# Patient Record
Sex: Female | Born: 1965 | Race: Black or African American | Hispanic: No | Marital: Married | Smoking: Never smoker
Health system: Southern US, Community
[De-identification: ages and names within clinical notes are randomized; demographics above are authoritative.]

## PROBLEM LIST (undated history)

## (undated) DIAGNOSIS — D649 Anemia, unspecified: Secondary | ICD-10-CM

## (undated) DIAGNOSIS — M797 Fibromyalgia: Secondary | ICD-10-CM

## (undated) DIAGNOSIS — E559 Vitamin D deficiency, unspecified: Secondary | ICD-10-CM

## (undated) DIAGNOSIS — Z9889 Other specified postprocedural states: Secondary | ICD-10-CM

## (undated) HISTORY — PX: THIGH LIFT: SHX2496

## (undated) HISTORY — DX: Anemia, unspecified: D64.9

## (undated) HISTORY — DX: Vitamin D deficiency, unspecified: E55.9

## (undated) HISTORY — PX: OTHER SURGICAL HISTORY: SHX169

## (undated) HISTORY — DX: Other specified postprocedural states: Z98.890

## (undated) HISTORY — DX: Fibromyalgia: M79.7

---
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
## 1999-09-23 ENCOUNTER — Other Ambulatory Visit: Admission: RE | Admit: 1999-09-23 | Discharge: 1999-09-23 | Payer: Self-pay | Admitting: Family Medicine

## 1999-10-03 ENCOUNTER — Encounter: Admission: RE | Admit: 1999-10-03 | Discharge: 2000-01-01 | Payer: Self-pay | Admitting: Family Medicine

## 1999-10-31 ENCOUNTER — Encounter: Admission: RE | Admit: 1999-10-31 | Discharge: 1999-10-31 | Payer: Self-pay | Admitting: Family Medicine

## 1999-10-31 ENCOUNTER — Encounter: Payer: Self-pay | Admitting: Family Medicine

## 2000-11-16 ENCOUNTER — Encounter: Admission: RE | Admit: 2000-11-16 | Discharge: 2001-02-14 | Payer: Self-pay | Admitting: Family Medicine

## 2000-11-16 ENCOUNTER — Other Ambulatory Visit: Admission: RE | Admit: 2000-11-16 | Discharge: 2000-11-16 | Payer: Self-pay | Admitting: Family Medicine

## 2000-11-30 HISTORY — PX: OTHER SURGICAL HISTORY: SHX169

## 2001-03-11 ENCOUNTER — Encounter: Admission: RE | Admit: 2001-03-11 | Discharge: 2001-06-09 | Payer: Self-pay | Admitting: Family Medicine

## 2003-12-01 HISTORY — PX: ABDOMINOPLASTY: SUR9

## 2004-11-10 ENCOUNTER — Encounter (HOSPITAL_COMMUNITY): Admission: RE | Admit: 2004-11-10 | Discharge: 2004-11-29 | Payer: Self-pay | Admitting: Family Medicine

## 2004-11-27 ENCOUNTER — Ambulatory Visit: Payer: Self-pay | Admitting: Oncology

## 2004-11-28 ENCOUNTER — Ambulatory Visit (HOSPITAL_COMMUNITY): Admission: AD | Admit: 2004-11-28 | Discharge: 2004-11-28 | Payer: Self-pay | Admitting: Oncology

## 2005-02-25 ENCOUNTER — Ambulatory Visit: Payer: Self-pay | Admitting: Oncology

## 2005-03-02 ENCOUNTER — Ambulatory Visit (HOSPITAL_COMMUNITY): Admission: RE | Admit: 2005-03-02 | Discharge: 2005-03-02 | Payer: Self-pay | Admitting: Unknown Physician Specialty

## 2005-05-01 ENCOUNTER — Other Ambulatory Visit: Admission: RE | Admit: 2005-05-01 | Discharge: 2005-05-01 | Payer: Self-pay | Admitting: Obstetrics and Gynecology

## 2005-05-27 ENCOUNTER — Ambulatory Visit: Payer: Self-pay | Admitting: Oncology

## 2005-07-02 ENCOUNTER — Encounter: Admission: RE | Admit: 2005-07-02 | Discharge: 2005-07-02 | Payer: Self-pay | Admitting: Obstetrics and Gynecology

## 2005-09-02 ENCOUNTER — Ambulatory Visit: Payer: Self-pay | Admitting: Oncology

## 2005-12-09 ENCOUNTER — Ambulatory Visit: Payer: Self-pay | Admitting: Oncology

## 2006-03-10 ENCOUNTER — Ambulatory Visit: Payer: Self-pay | Admitting: Oncology

## 2006-03-11 LAB — CBC WITH DIFFERENTIAL/PLATELET
BASO%: 0.5 % (ref 0.0–2.0)
Basophils Absolute: 0 10*3/uL (ref 0.0–0.1)
Eosinophils Absolute: 0.1 10*3/uL (ref 0.0–0.5)
HCT: 37.6 % (ref 34.8–46.6)
MCH: 31 pg (ref 26.0–34.0)
NEUT#: 3.3 10*3/uL (ref 1.5–6.5)
NEUT%: 64.5 % (ref 39.6–76.8)
Platelets: 173 10*3/uL (ref 145–400)
RDW: 13.7 % (ref 11.3–14.5)
WBC: 5.2 10*3/uL (ref 3.9–10.0)
lymph#: 1.1 10*3/uL (ref 0.9–3.3)

## 2006-03-11 LAB — IRON AND TIBC
Iron: 49 ug/dL (ref 42–145)
TIBC: 326 ug/dL (ref 250–470)
UIBC: 277 ug/dL

## 2006-06-15 ENCOUNTER — Ambulatory Visit: Payer: Self-pay | Admitting: Oncology

## 2006-06-18 LAB — CBC WITH DIFFERENTIAL/PLATELET
Basophils Absolute: 0 10*3/uL (ref 0.0–0.1)
MCH: 31.2 pg (ref 26.0–34.0)
RBC: 4.05 10*6/uL (ref 3.70–5.32)
RDW: 14 % (ref 11.3–14.5)
WBC: 4.1 10*3/uL (ref 3.9–10.0)
lymph#: 1 10*3/uL (ref 0.9–3.3)

## 2006-06-18 LAB — IRON AND TIBC
TIBC: 253 ug/dL (ref 250–470)
UIBC: 168 ug/dL

## 2006-06-30 ENCOUNTER — Encounter: Payer: Self-pay | Admitting: Vascular Surgery

## 2006-06-30 ENCOUNTER — Ambulatory Visit (HOSPITAL_COMMUNITY): Admission: RE | Admit: 2006-06-30 | Discharge: 2006-06-30 | Payer: Self-pay | Admitting: Family Medicine

## 2006-07-12 ENCOUNTER — Other Ambulatory Visit: Admission: RE | Admit: 2006-07-12 | Discharge: 2006-07-12 | Payer: Self-pay | Admitting: Obstetrics and Gynecology

## 2006-09-07 ENCOUNTER — Encounter: Admission: RE | Admit: 2006-09-07 | Discharge: 2006-09-07 | Payer: Self-pay | Admitting: Obstetrics and Gynecology

## 2006-12-14 ENCOUNTER — Ambulatory Visit: Payer: Self-pay | Admitting: Oncology

## 2006-12-17 LAB — IRON AND TIBC
%SAT: 43 % (ref 20–55)
Iron: 109 ug/dL (ref 42–145)
TIBC: 255 ug/dL (ref 250–470)

## 2006-12-17 LAB — COMPREHENSIVE METABOLIC PANEL
Glucose, Bld: 57 mg/dL — ABNORMAL LOW (ref 70–99)
Total Bilirubin: 1.5 mg/dL — ABNORMAL HIGH (ref 0.3–1.2)

## 2006-12-17 LAB — CBC WITH DIFFERENTIAL/PLATELET
BASO%: 1 % (ref 0.0–2.0)
Basophils Absolute: 0 10*3/uL (ref 0.0–0.1)
HCT: 38.9 % (ref 34.8–46.6)
HGB: 12.6 g/dL (ref 11.6–15.9)
LYMPH%: 20.4 % (ref 14.0–48.0)
MCH: 31.2 pg (ref 26.0–34.0)
MCV: 96.2 fL (ref 81.0–101.0)
MONO%: 12.7 % (ref 0.0–13.0)
NEUT%: 64.2 % (ref 39.6–76.8)
RBC: 4.04 10*6/uL (ref 3.70–5.32)
RDW: 12 % (ref 11.3–14.5)
WBC: 4.9 10*3/uL (ref 3.9–10.0)
lymph#: 1 10*3/uL (ref 0.9–3.3)

## 2006-12-17 LAB — LACTATE DEHYDROGENASE: LDH: 131 U/L (ref 94–250)

## 2007-06-21 ENCOUNTER — Ambulatory Visit: Payer: Self-pay | Admitting: Oncology

## 2007-06-24 LAB — CBC WITH DIFFERENTIAL/PLATELET
Basophils Absolute: 0 10*3/uL (ref 0.0–0.1)
EOS%: 0.8 % (ref 0.0–7.0)
HGB: 13.6 g/dL (ref 11.6–15.9)
LYMPH%: 18.5 % (ref 14.0–48.0)
MCH: 31.6 pg (ref 26.0–34.0)
MCV: 91.8 fL (ref 81.0–101.0)
MONO%: 10.7 % (ref 0.0–13.0)
Platelets: 212 10*3/uL (ref 145–400)
RDW: 13.3 % (ref 11.3–14.5)

## 2007-06-24 LAB — IRON AND TIBC: UIBC: 218 ug/dL

## 2007-06-24 LAB — COMPREHENSIVE METABOLIC PANEL
AST: 19 U/L (ref 0–37)
Albumin: 4.1 g/dL (ref 3.5–5.2)
Alkaline Phosphatase: 82 U/L (ref 39–117)
BUN: 9 mg/dL (ref 6–23)
Creatinine, Ser: 0.62 mg/dL (ref 0.40–1.20)
Potassium: 3.9 mEq/L (ref 3.5–5.3)
Total Bilirubin: 1 mg/dL (ref 0.3–1.2)

## 2007-08-29 ENCOUNTER — Other Ambulatory Visit: Admission: RE | Admit: 2007-08-29 | Discharge: 2007-08-29 | Payer: Self-pay | Admitting: Obstetrics and Gynecology

## 2007-10-06 ENCOUNTER — Encounter: Admission: RE | Admit: 2007-10-06 | Discharge: 2007-10-06 | Payer: Self-pay | Admitting: Obstetrics and Gynecology

## 2007-10-13 ENCOUNTER — Encounter: Admission: RE | Admit: 2007-10-13 | Discharge: 2007-10-13 | Payer: Self-pay | Admitting: Obstetrics and Gynecology

## 2007-12-22 ENCOUNTER — Ambulatory Visit: Payer: Self-pay | Admitting: Oncology

## 2007-12-26 LAB — CBC WITH DIFFERENTIAL/PLATELET
BASO%: 0.8 % (ref 0.0–2.0)
Eosinophils Absolute: 0 10*3/uL (ref 0.0–0.5)
HCT: 36.5 % (ref 34.8–46.6)
LYMPH%: 21.2 % (ref 14.0–48.0)
MCHC: 34.2 g/dL (ref 32.0–36.0)
MONO#: 0.5 10*3/uL (ref 0.1–0.9)
NEUT%: 68.7 % (ref 39.6–76.8)
Platelets: 173 10*3/uL (ref 145–400)
WBC: 5.7 10*3/uL (ref 3.9–10.0)

## 2007-12-26 LAB — FERRITIN: Ferritin: 302 ng/mL — ABNORMAL HIGH (ref 10–291)

## 2007-12-26 LAB — IRON AND TIBC: %SAT: 37 % (ref 20–55)

## 2008-05-01 ENCOUNTER — Encounter: Admission: RE | Admit: 2008-05-01 | Discharge: 2008-05-01 | Payer: Self-pay | Admitting: Obstetrics and Gynecology

## 2008-06-12 ENCOUNTER — Ambulatory Visit (HOSPITAL_COMMUNITY): Admission: RE | Admit: 2008-06-12 | Discharge: 2008-06-12 | Payer: Self-pay | Admitting: Obstetrics and Gynecology

## 2008-06-12 ENCOUNTER — Encounter (INDEPENDENT_AMBULATORY_CARE_PROVIDER_SITE_OTHER): Payer: Self-pay | Admitting: Obstetrics and Gynecology

## 2008-06-19 ENCOUNTER — Ambulatory Visit: Payer: Self-pay | Admitting: Oncology

## 2008-06-22 LAB — LACTATE DEHYDROGENASE: LDH: 148 U/L (ref 94–250)

## 2008-06-22 LAB — COMPREHENSIVE METABOLIC PANEL
AST: 17 U/L (ref 0–37)
BUN: 9 mg/dL (ref 6–23)
Calcium: 8.4 mg/dL (ref 8.4–10.5)
Chloride: 109 mEq/L (ref 96–112)
Glucose, Bld: 65 mg/dL — ABNORMAL LOW (ref 70–99)
Potassium: 4 mEq/L (ref 3.5–5.3)
Sodium: 142 mEq/L (ref 135–145)
Total Protein: 6.4 g/dL (ref 6.0–8.3)

## 2008-06-22 LAB — CBC WITH DIFFERENTIAL/PLATELET
BASO%: 0.4 % (ref 0.0–2.0)
Basophils Absolute: 0 10*3/uL (ref 0.0–0.1)
EOS%: 0.6 % (ref 0.0–7.0)
Eosinophils Absolute: 0 10*3/uL (ref 0.0–0.5)
HCT: 37.4 % (ref 34.8–46.6)
MCH: 31.3 pg (ref 26.0–34.0)
Platelets: 187 10*3/uL (ref 145–400)
RDW: 13.6 % (ref 11.3–14.5)

## 2008-06-22 LAB — IRON AND TIBC
%SAT: 24 % (ref 20–55)
Iron: 71 ug/dL (ref 42–145)

## 2008-06-22 LAB — FERRITIN: Ferritin: 115 ng/mL (ref 10–291)

## 2008-12-19 ENCOUNTER — Encounter: Admission: RE | Admit: 2008-12-19 | Discharge: 2008-12-19 | Payer: Self-pay | Admitting: Obstetrics and Gynecology

## 2008-12-21 ENCOUNTER — Ambulatory Visit: Payer: Self-pay | Admitting: Oncology

## 2008-12-25 LAB — CBC WITH DIFFERENTIAL/PLATELET
Eosinophils Absolute: 0.1 10*3/uL (ref 0.0–0.5)
HGB: 13 g/dL (ref 11.6–15.9)
LYMPH%: 20.7 % (ref 14.0–48.0)
MCH: 31.3 pg (ref 26.0–34.0)
MCHC: 33.3 g/dL (ref 32.0–36.0)
MCV: 94 fL (ref 81.0–101.0)
MONO#: 0.7 10*3/uL (ref 0.1–0.9)
NEUT#: 4.6 10*3/uL (ref 1.5–6.5)
Platelets: 195 10*3/uL (ref 145–400)

## 2008-12-27 LAB — IRON AND TIBC
%SAT: 22 % (ref 20–55)
Iron: 56 ug/dL (ref 42–145)

## 2008-12-27 LAB — TRANSFERRIN RECEPTOR, SOLUABLE: Transferrin Receptor, Soluble: 23.2 nmol/L

## 2009-06-20 ENCOUNTER — Ambulatory Visit: Payer: Self-pay | Admitting: Oncology

## 2009-06-21 ENCOUNTER — Ambulatory Visit (HOSPITAL_BASED_OUTPATIENT_CLINIC_OR_DEPARTMENT_OTHER): Admission: RE | Admit: 2009-06-21 | Discharge: 2009-06-21 | Payer: Self-pay

## 2010-01-08 ENCOUNTER — Encounter: Payer: Self-pay | Admitting: Cardiology

## 2010-01-22 ENCOUNTER — Ambulatory Visit: Payer: Self-pay

## 2010-01-22 ENCOUNTER — Encounter: Payer: Self-pay | Admitting: Cardiology

## 2010-01-30 ENCOUNTER — Encounter: Admission: RE | Admit: 2010-01-30 | Discharge: 2010-01-30 | Payer: Self-pay | Admitting: Family Medicine

## 2010-02-05 ENCOUNTER — Ambulatory Visit: Payer: Self-pay | Admitting: Cardiology

## 2010-02-05 DIAGNOSIS — I4949 Other premature depolarization: Secondary | ICD-10-CM | POA: Insufficient documentation

## 2010-02-05 DIAGNOSIS — E568 Deficiency of other vitamins: Secondary | ICD-10-CM | POA: Insufficient documentation

## 2010-02-05 DIAGNOSIS — I498 Other specified cardiac arrhythmias: Secondary | ICD-10-CM | POA: Insufficient documentation

## 2010-02-25 ENCOUNTER — Encounter: Payer: Self-pay | Admitting: Cardiology

## 2010-02-25 ENCOUNTER — Ambulatory Visit (HOSPITAL_COMMUNITY): Admission: RE | Admit: 2010-02-25 | Discharge: 2010-02-25 | Payer: Self-pay | Admitting: Cardiology

## 2010-02-25 ENCOUNTER — Ambulatory Visit: Payer: Self-pay | Admitting: Cardiology

## 2010-02-25 ENCOUNTER — Ambulatory Visit: Payer: Self-pay

## 2010-03-06 ENCOUNTER — Telehealth (INDEPENDENT_AMBULATORY_CARE_PROVIDER_SITE_OTHER): Payer: Self-pay | Admitting: *Deleted

## 2011-01-01 NOTE — Assessment & Plan Note (Signed)
Summary: np6/palps/jml   Visit Type:  Initial Consult Primary Provider:  Dr. Charlesetta Shanks  CC:  PVCs.  History of Present Illness: The patient presents for evaluation of premature ventricular contractions. She has a history of diabetes she says dating back to about 2000. She wore a monitor and was told she had PVCs. She reports no other workup for these. She says she feels them occasionally. Sometimes worse than others but never sustained. She's not had any associated presyncope or syncope. They do not occur with activity. She doesn't think they're getting worse with time. Recently she's been evaluated by her endocrinologist for nutritional deficiencies related to weight loss surgery in the past. She was noted to have frequent ectopy during examination. She was also noted to have a somewhat low heart rate recently. Again she does not feel this. She doesn't exercise but she is active at work. She goes up and down stairs and walks quite a bit. With this she denies any chest pressure, neck or arm discomfort. She doesn't have any shortness of breath, PND or orthopnea. She sleeps with her head slightly up because of sinus drainage.  Of note the patient wore a 24-hour Holter monitor which did demonstrate PVCs but no sustained dysrhythmias. She had about 2000 PVCs out of 100,000 beats. She did have an episode of 6 beats of an atrial tachycardia but no sustained dysrhythmias. She felt the PVCs like she usually does but didn't feel any on usual symptoms.  Current Medications (verified): 1)  Vitamin D (Ergocalciferol) 50000 Unit Caps (Ergocalciferol) .Marland Kitchen.. 1 By Mouth Every Other Day 2)  Nitrofurantoin Macrocrystal 100 Mg Caps (Nitrofurantoin Macrocrystal) .Marland Kitchen.. 1 By Mouth Two Times A Day 3)  Multivitamins   Tabs (Multiple Vitamin) .Marland Kitchen.. 1 Podaily  Allergies (verified): 1)  ! Sulfa  Past History:  Past Medical History: Low vitamin D, zinc  Past Surgical History: Biliopancreatic diversion with duodenal  switch Breast reduction Abdominoplasty Thigh lift  Family History: The patient does not know her paternal side. No history of early coronary artery disease. 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 Otherwise as stated in the history of present illness negative for all other systems.  Vital Signs:  Patient profile:   45 year old female Height:      67 inches Weight:      220 pounds BMI:     34.58 Pulse rate:   58 / minute Resp:     16 per minute BP sitting:   102 / 76  (right arm)  Vitals Entered By: Marrion Coy, CNA (February 05, 2010 10:32 AM)  Physical Exam  General:  Well developed, well nourished, in no acute distress. Head:  normocephalic and atraumatic Eyes:  PERRLA/EOM intact; conjunctiva and lids normal. Mouth:  Teeth, gums and palate normal. Oral mucosa normal. Neck:  Neck supple, no JVD. No masses, thyromegaly or abnormal cervical nodes. Chest Wall:  no deformities or breast masses noted Lungs:  Clear bilaterally to auscultation and percussion. Abdomen:  well-healed surgical scars, her aortic pulsation is prominent but not at all enlarged and there is no bruits, no organomegaly Msk:  Back normal, normal gait. Muscle strength and tone normal. Extremities:  No clubbing or cyanosis. Neurologic:  Alert and oriented x 3. Skin:  Intact without lesions or rashes. Cervical Nodes:  no significant adenopathy Axillary  Nodes:  no significant adenopathy Inguinal Nodes:  no significant adenopathy Psych:  Normal affect.   Detailed Cardiovascular Exam  Neck    Carotids: Carotids full and equal bilaterally without bruits.      Neck Veins: Normal, no JVD.    Heart    Inspection: no deformities or lifts noted.      Palpation: normal PMI with no thrills palpable.      Auscultation: regular rate and rhythm,  S1, S2 without murmurs, rubs, gallops, or clicks.    Vascular    Abdominal Aorta: no palpable masses, pulsations, or audible bruits.      Femoral Pulses: normal femoral pulses bilaterally.      Pedal Pulses: normal pedal pulses bilaterally.      Radial Pulses: normal radial pulses bilaterally.      Peripheral Circulation: no clubbing, cyanosis, or edema noted with normal capillary refill.     EKG  Procedure date:  02/05/2010  Findings:      sinus rhythm, rate 58, axis within normal limits, intervals within normal limits, no acute ST-T wave changes.  Impression & Recommendations:  Problem # 1:  PREMATURE VENTRICULAR CONTRACTIONS (ICD-427.69) The patient has PVCs as described and rare PACs. She had one short run of atrial tachycardia. These are not more symptomatic than they have been in the past and so would not require treatt. She does not know her family history. Therefore we should exclude structural heart disease which could be related to inheritable causes. She needs an echocardiogram per ACC/AHA appropriateness criteria. Orders: EKG w/ Interpretation (93000) Echocardiogram (Echo)  Problem # 2:  BRADYCARDIA (ICD-427.89) She has sinus bradycardia and sinus arrhythmia but no pauses. No particular therapy is indicated.  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 I would not suggest that these are contributing to any arrhythmias.  Patient Instructions: 1)  Your physician recommends that you schedule a follow-up appointment as needed if echocardiogram is nornal 2)  Your physician recommends that you continue on your current medications as directed. Please refer to the Current Medication list given to you today. 3)  Your physician has requested that you have an echocardiogram.  Echocardiography is a painless test that uses sound waves to create images of your heart. It provides your doctor with information about the  size and shape of your heart and how well your heart's chambers and valves are working.  This procedure takes approximately one hour. There are no restrictions for this procedure.

## 2011-01-01 NOTE — Consult Note (Signed)
Summary: Regional Physician's Referral Request   Regional Physician's Referral Request   Imported By: Roderic Ovens 05/28/2010 14:36:16  _____________________________________________________________________  External Attachment:    Type:   Image     Comment:   External Document

## 2011-01-01 NOTE — Progress Notes (Signed)
Summary: Urgent: Call patient and send consult note, please  Phone Note From Other Clinic Call back at [phone] (Dr. Celene Skeen)   Caller: Dr Celene Skeen Summary of Call: Dr. Celene Skeen called to ask that we call the patient with her echo results and send Dr. Celene Skeen a copy of the consult note.  I assured Dr. Celene Skeen that we would do both as soon as possible.  I did reassure her that it was our policy to call  patients with all diagnostic testing results and that normal or close to normal results might pend physicians review before the call was made.  I apologized for the fact that the patient was upset.  I'm sure we will [place] fax the consult note, but I believe she said her fax number is [phone] in case we need it.  Jen Initial call taken by: Fabio Neighbors     Appended Document: Urgent: Call patient and send consult note, please called pt at work and left message for her of normal results and also to call back if further questions  Appended Document: Urgent: Call patient and send consult note, please left message at home number listed of normal results and to call back if any questions.  Also the consult by Dr Antoine Poche and the 2 D Echo results were faxed to Dr Celene Skeen.

## 2011-01-01 NOTE — Procedures (Signed)
Summary: Holter  Holter   Imported By: Marylou Mccoy 01/24/2010 11:47:45  _____________________________________________________________________  External Attachment:    Type:   Image     Comment:   External Document

## 2011-04-14 NOTE — Op Note (Signed)
NAMEMANJOT, BEUMER NO.:  [id number]   MEDICAL RECORD NO.:  [id number]          PATIENT TYPE:  AMB   LOCATION:  SDC                           FACILITY:  [facility]   PHYSICIAN:  Miguel Aschoff, M.D.       DATE OF BIRTH:  1966/08/11   DATE OF PROCEDURE:  DATE OF DISCHARGE:                               OPERATIVE REPORT   PREOPERATIVE DIAGNOSIS:  Menometrorrhagia.   POSTOPERATIVE DIAGNOSIS:  Menometrorrhagia.   PROCEDURE:  Cervical dilatation, hysteroscopy, uterine curettage,  NovaSure endometrial ablation.   SURGEON:  Miguel Aschoff, MD   ANESTHESIA:  General.   COMPLICATIONS:  None.   JUSTIFICATION:  The patient is a 45 year old black female with history  of very heavy menses and now reports that she has been bleeding for 6  weeks.  She now requested a definitive procedure be carried out and has  given written informed consent for D&C and NovaSure endometrial  ablation.  The risks and benefits of this procedure were discussed with  the patient.  Informed consent has been obtained.   PROCEDURE:  The patient was taken to the operating room and placed in  supine position.  General anesthesia was administered without  difficulty.  She was then placed in dorsal lithotomy position, prepped  and draped in the usual sterile fashion.  Bladder was catheterized.  Examination under anesthesia revealed normal external genitalia, normal  Bartholin and Skene's glands, normal urethra.  The vaginal vault was  without gross lesion.  The cervix was noted be patulous, but otherwise  without lesion.  The uterus was noted to be normal size and shape and no  adnexal masses were noted.  At this point, the endometrial cavity was  sounded to 8 cm.  A cervical length of 3.5 cm was determined for a  cavity length of 4.5 cm.  Once this was done, because the cervix was  patulous, it was not necessary to dilate the cervix.  The hysteroscope  was advanced through the endocervical canal and no  endocervical lesions  were noted.  The endometrial cavity was then inspected.  There were no  submucous myomas or polyps found.  Cavity appeared smooth and regular.  At this point, the hysteroscope was removed and vigorous sharp curettage  was carried out using a medium-size serrated curette and this was sent  for histologic study.  Following this, NovaSure endometrial ablation  unit was introduced through the cervix and a cavity width of 4 cm was  found.  At this point, a treatment cycle was carried out for  approximately 110 seconds at 99 W.  On completion of the treatment  cycle, the instrument was removed intact.  The hysteroscope was re-  introduced and the cavity appeared to be well coagulated.  The NovaSure  procedure was then completed.  The instruments were removed prior to  removing the speculum.  The cervix was injected with 18 mL of 1%  Xylocaine for analgesia.   Plan is for the patient to be discharged home.   Medications for home include;  1. Darvocet-N 100 one every 4 hours needed for pain.  2. Doxycycline one twice a day x3 days.   The patient is call for any problems such as fever, pain, or heavy  bleeding.  She is instructed to place nothing in the vagina for 2 weeks.  She has been set up for follow-up examination in 4 weeks.      Miguel Aschoff, M.D.  Electronically Signed     AR/MEDQ  D:  06/12/2008  T:  06/12/2008  Job:  [id number]

## 2011-04-14 NOTE — Op Note (Signed)
NAMEDOROTHEE, NAPIERKOWSKI            ACCOUNT NO.:  [id number]   MEDICAL RECORD NO.:  [id number]          PATIENT TYPE:  AMB   LOCATION:  DSC                          FACILITY:  [facility]   PHYSICIAN:  Alvan Dame, D.P.M. DATE OF BIRTH:  1966-08-19   DATE OF PROCEDURE:  06/21/2009  DATE OF DISCHARGE:                               OPERATIVE REPORT   SURGEON:  Alvan Dame, D.P.M.   PREOPERATIVE DIAGNOSES:  1. Hammertoe deformity, fifth toe, right foot.  2. Soft tissue lesion, keratoses, fourth webspace right foot.   POSTOPERATIVE DIAGNOSES:  1. Hammertoe deformity, fifth toe, right foot.  2. Soft tissue lesion, keratoses, fourth webspace, right foot.   OPERATIVE PROCEDURE:  1. Hammertoe repair, phalangeal head resection, fifth toe, right foot.  2. Partial syndactyly/excision of soft tissue lesion and partial      webbing procedure, fourth webspace, right foot.   INDICATIONS FOR SURGERY:  The patient has had a several year history of  painful hammertoe deformity with associated keratosis, had previous  debridement, change of shoes, padding and cushioning.  Previously, had  been seen and treated by Dr. Leeanne Deed and at this time, the patient is  referred for surgical intervention and correction of hammertoe deformity  with phalangeal head resection.  X-rays confirmed the deformity.  Per  patient request and my recommendation, surgery was proceeded as  scheduled.  There were no complications noted.   ANESTHESIA:  IV sedation or MAC anesthesia with local anesthetic  administered, a total 9 mL 50/50 mixture of 2% Xylocaine plain and 0.5%  Marcaine plain in a regional block fashion.   HEMOSTASIS:  Right ankle tourniquet 250 mmHg.   FINDINGS AND PROCEDURES:  The patient was brought to the OR, placed on  table in supine position.  IV sedation was established.  The leg was  exsanguinated with Esmarch wrap and tourniquet was inflated to 250 mmHg.  The following procedure was then carried  out.  Procedure #1, excision of  lesion and partial webbing procedure #1, fourth webspace, right foot.   Attention was directed to the fourth webspace of the right foot where  adjacent keratotic lesions were identified on the medial lateral surface  of the adjacent fourth and fifth digits.  Utilizing a skin marker, the  keratotic lesion was outlined along the fifth digit.  The digits were  opposed to one and other and the skin marking was transferred to the  adjacent fourth medial surface.  Utilizing the scalpel, a skin incision  was made at this time circumferentially around the encompassed skin  lesion.  This web lesion at this time completely excised and submitted  for pathology.  After being completely excised, the underlying soft  tissue was noted to be free of any signs of infection, any other  deformity or abnormal tissues or cyst, however underlying this tissue  was noted to be a prominence of bony prominence which will be addressed  with a secondary procedure.  Prior to closure for this procedure and  completion, procedure #2 will be carried out as follows.   Procedure #2:  Hammertoe repair/arthroplasty with phalangeal head resection of  fifth  toe, right foot.  Attention was directed now to the fifth digit of the  right foot where through the previous interspace incision, a medial  capsular incision was made over the proximal IP joint of the second  digit.  The capsular incision was made in transverse fashion from dorsal  to plantar and the medial collateral ligaments of the fifth digit,  proximal IP joint were severed, capsular tissues were further severed  and the head of the fifth digit proximal phalanx was delivered into the  operative field.  The medial collateral wounds were also at this time  incised and the entire proximal phalangeal head was delivered into the  field utilizing proper instrumentation.  Proximal phalangeal head was  resected perpendicular to its shaft.   All rough edges were smoothed.  At  this time, site was lavaged with copious amounts of sterile antibiotic  solution and cleared of all soft tissue and also debris.  Closure for  this procedure was done as following; capsular tissues were  reapproximated utilizing 4-0 Vicryl in a combination of simple  interrupted and continuous interlocking fashion.   Upon completion of the hammer toe arthroplasty, completion of the  partial syndactyly or webbing procedure was completed as follows.  By  utilizing 4-0 nylon suture, the adjacent surfaces of the fourth and  fifth digits were sutured together in a running horizontal mattress  fashion from inferior to superior.  Utilizing skin hooks, the sutures  were pulled in a tightening fashion gently everting the skin edges the  entire length of the interdigital web producing a nice skin interface  from plantar to dorsal in the fourth webspace.  At the dorsal surface of  the fourth webspace, the suture was tied in the usual fashion.   Upon completion of both procedures, first, the fourth webspace was then  infiltrated with a total of 1/2 mL of dexamethasone phosphate 10 mg/mL,  Betadine soaked 2 x 2 and gauze dressings were then applied to the left  forefoot.  Ankle tourniquet was deflated with immediate return of  perfusion to all digits being noted.   The patient was transferred from OR to recovery in satisfactory  condition.  Discharged with following postop instructions, prescriptions  for pain and antibiotic medication and an appointment of her followup  office visit had been made.     ______________________________  Alvan Dame, D.P.M.    ______________________________  Alvan Dame, D.P.M.    RS/MEDQ  D:  06/21/2009  T:  06/22/2009  Job:  [id number]

## 2011-06-11 ENCOUNTER — Other Ambulatory Visit: Payer: Self-pay | Admitting: Otolaryngology

## 2011-06-11 DIAGNOSIS — R519 Headache, unspecified: Secondary | ICD-10-CM

## 2011-06-22 ENCOUNTER — Ambulatory Visit
Admission: RE | Admit: 2011-06-22 | Discharge: 2011-06-22 | Disposition: A | Discharge status: Home / Self Care | Payer: 59 | Source: Ambulatory Visit | Attending: Otolaryngology | Admitting: Otolaryngology

## 2011-06-22 DIAGNOSIS — R519 Headache, unspecified: Secondary | ICD-10-CM

## 2011-06-22 MED ORDER — IOHEXOL 300 MG/ML  SOLN
75.0000 mL | Freq: Once | INTRAMUSCULAR | Status: AC | PRN
  Administered 2011-06-22: 75 mL via INTRAVENOUS

## 2011-08-27 LAB — URINE MICROSCOPIC-ADD ON

## 2011-08-27 LAB — URINALYSIS, ROUTINE W REFLEX MICROSCOPIC
Glucose, UA: NEGATIVE
Protein, ur: NEGATIVE
Specific Gravity, Urine: 1.025

## 2011-08-27 LAB — CBC
HCT: 37.9
MCV: 96.5
Platelets: 172
RDW: 13.7
WBC: 5.1

## 2011-08-27 LAB — PROTIME-INR: Prothrombin Time: 13.2

## 2012-04-04 ENCOUNTER — Ambulatory Visit: Payer: 59 | Admitting: Internal Medicine

## 2012-05-10 ENCOUNTER — Encounter: Payer: Self-pay | Admitting: Internal Medicine

## 2012-05-10 ENCOUNTER — Ambulatory Visit (INDEPENDENT_AMBULATORY_CARE_PROVIDER_SITE_OTHER): Payer: 59 | Admitting: Internal Medicine

## 2012-05-10 VITALS — BP 114/76 | HR 57 | Temp 98.4°F | Resp 16 | Ht 67.75 in | Wt 231.0 lb

## 2012-05-10 DIAGNOSIS — Z9884 Bariatric surgery status: Secondary | ICD-10-CM | POA: Insufficient documentation

## 2012-05-10 DIAGNOSIS — I890 Lymphedema, not elsewhere classified: Secondary | ICD-10-CM

## 2012-05-10 DIAGNOSIS — K909 Intestinal malabsorption, unspecified: Secondary | ICD-10-CM

## 2012-05-10 DIAGNOSIS — Z9889 Other specified postprocedural states: Secondary | ICD-10-CM

## 2012-05-10 DIAGNOSIS — L218 Other seborrheic dermatitis: Secondary | ICD-10-CM

## 2012-05-10 DIAGNOSIS — Z8739 Personal history of other diseases of the musculoskeletal system and connective tissue: Secondary | ICD-10-CM

## 2012-05-10 DIAGNOSIS — Z862 Personal history of diseases of the blood and blood-forming organs and certain disorders involving the immune mechanism: Secondary | ICD-10-CM

## 2012-05-10 DIAGNOSIS — L219 Seborrheic dermatitis, unspecified: Secondary | ICD-10-CM | POA: Insufficient documentation

## 2012-05-10 LAB — LIPID PANEL
HDL: 62 mg/dL (ref >39)
LDL Cholesterol: 68 mg/dL (ref 0–99)
Total CHOL/HDL Ratio: 2.3 Ratio
VLDL: 10 mg/dL (ref 0–40)

## 2012-05-10 LAB — CBC WITH DIFFERENTIAL/PLATELET
Basophils Absolute: 0 10*3/uL (ref 0.0–0.1)
Basophils Relative: 1 % (ref 0–1)
Eosinophils Absolute: 0 10*3/uL (ref 0.0–0.7)
Eosinophils Relative: 1 % (ref 0–5)
HCT: 39 % (ref 36.0–46.0)
MCHC: 32.6 g/dL (ref 30.0–36.0)
MCV: 93.3 fL (ref 78.0–100.0)
Monocytes Absolute: 0.7 10*3/uL (ref 0.1–1.0)
Platelets: 213 10*3/uL (ref 150–400)
RDW: 13.4 % (ref 11.5–15.5)

## 2012-05-10 LAB — IRON: Iron: 86 ug/dL (ref 42–145)

## 2012-05-10 LAB — COMPREHENSIVE METABOLIC PANEL
AST: 22 U/L (ref 0–37)
Alkaline Phosphatase: 86 U/L (ref 39–117)
BUN: 9 mg/dL (ref 6–23)
Calcium: 8.3 mg/dL — ABNORMAL LOW (ref 8.4–10.5)
Creat: 0.54 mg/dL (ref 0.50–1.10)
Total Bilirubin: 1.2 mg/dL (ref 0.3–1.2)

## 2012-05-10 NOTE — Progress Notes (Signed)
  Subjective:    Patient ID: Jennifer Harrell, female    DOB: 12/27/1965, 46 y.o.   MRN: [id number]  HPI  New pt here for first visit.  Former care Dr. Merryl Hacker and Urgent care.  PMH of Fe deficiency anemia that has improved post endometrial ablation, Chronic bilarteral leg  Lymphedema post abdominoplasty and thigh lift,  Gastric bypass,  Malabsorption syndrome, vitamin D deficiency,  PVC's ,  Scalp dermatitis and remote fibromyalgia  She is overall doing well.   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  She had taken high dose Vitamin d in past but has not had labs checked in a while.  Vitamin D prescribed for her by Dr. Talmage Nap. She does take her MVI daily as she reports malabsorption after wt loss bypass surgery.    No chest pain or palpitaitons  Allergies  Allergen Reactions  . Prednisone Swelling  . Sulfonamide Derivatives    Past Medical History  Diagnosis Date  . Vitamin d deficiency   . Anemia   . History of breast surgery 1983    reduction  . Fibromyalgia    Past Surgical History  Procedure Date  . Breast reduction   . Bpdd weight loss surgery 2002  . Uterine ablation   . Abdominoplasty 2005  . Thigh lift    History   Social History  . Marital Status: Married    Spouse Name: N/A    Number of Children: N/A  . Years of Education: N/A   Occupational History  . Cytogeneticist    Social History Main Topics  . Smoking status: Never Smoker   . Smokeless tobacco: Never Used  . Alcohol Use: No  . Drug Use: No  . Sexually Active: Yes -- Female partner(s)   Other Topics Concern  . Not on file   Social History Narrative  . No narrative on file   Family History  Problem Relation Age of Onset  . Cancer Maternal Grandfather     stomach  . Diabetes Mother   . Diabetes Maternal Grandfather   . Heart failure Maternal Grandfather   . Heart failure Maternal Grandmother    Patient Active Problem List  Diagnoses  . DEFICIENCY OF OTHER VITAMINS  .  PREMATURE VENTRICULAR CONTRACTIONS  . BRADYCARDIA   No current outpatient prescriptions on file prior to visit.      Review of Systems    see HPI Objective:   Physical Exam Physical Exam  Nursing note and vitals reviewed.  Constitutional: She is oriented to person, place, and time. She appears well-developed and well-nourished.  HENT:  Head: Normocephalic and atraumatic.  Cardiovascular: Normal rate and regular rhythm. Exam reveals no gallop and no friction rub.  No murmur heard.  Pulmonary/Chest: Breath sounds normal. She has no wheezes. She has no rales.  Neurological: She is alert and oriented to person, place, and time.  Skin: Skin is warm and dry.  Psychiatric: She has a normal mood and affect. Her behavior is normal.  Ext  Edema of both legs L > R      Assessment & Plan:  Malabsortion post gastric bypass  Will check Vit D, A and b12 today  History of anemia  Will get  CbC and iron studies today  Scalp dermatitis    History of PVC's  Remote fibormyalgia

## 2012-05-10 NOTE — Patient Instructions (Signed)
Schedule CPE   Labs will be mailed to you 

## 2012-05-11 LAB — VITAMIN D 25 HYDROXY (VIT D DEFICIENCY, FRACTURES): Vit D, 25-Hydroxy: 17 ng/mL — ABNORMAL LOW (ref 30–89)

## 2012-05-12 ENCOUNTER — Telehealth: Payer: Self-pay | Admitting: Internal Medicine

## 2012-05-12 ENCOUNTER — Encounter: Payer: Self-pay | Admitting: Internal Medicine

## 2012-05-12 DIAGNOSIS — E559 Vitamin D deficiency, unspecified: Secondary | ICD-10-CM | POA: Insufficient documentation

## 2012-05-12 MED ORDER — VITAMIN D3 1.25 MG (50000 UT) PO CAPS: 1.0000 | ORAL_CAPSULE | ORAL | Status: DC

## 2012-05-12 NOTE — Telephone Encounter (Signed)
Jennifer Harrell   Call pt and tell her that her vitamin D is still very low  (17)  I will e-scribe vitamin D  50,0000 units once a week for 8 weeks.  Will recheck her vitamin D at her CPE appt.    Also let her know that they could not run her vitamin A test - in wrong tube for transport.  She was not charged for the vitamin A test  Tell her I do not need for her to redraw this

## 2012-05-12 NOTE — Telephone Encounter (Signed)
LM on home number of Lab results also a copy of labs mailed to pt's home address.  RX sent to pharmacy for Vit D 50,000 units per Dr Constance Goltz.

## 2012-05-19 ENCOUNTER — Telehealth: Payer: Self-pay | Admitting: *Deleted

## 2012-05-19 NOTE — Telephone Encounter (Signed)
Pt called stating that her RX for Vitamin D was not @ the pharmacy.  Per the chart it was sent to [facility] on [address] on the 05/12/12.  I LM on the pharmacy voicemail to reorder the med and LM on pt's home phone that I did do this.

## 2012-08-15 ENCOUNTER — Encounter: Payer: Self-pay | Admitting: Internal Medicine

## 2012-08-15 ENCOUNTER — Ambulatory Visit (INDEPENDENT_AMBULATORY_CARE_PROVIDER_SITE_OTHER): Payer: 59 | Admitting: Internal Medicine

## 2012-08-15 VITALS — BP 138/80 | HR 66 | Temp 98.0°F | Resp 18 | Wt 237.0 lb

## 2012-08-15 DIAGNOSIS — I4949 Other premature depolarization: Secondary | ICD-10-CM

## 2012-08-15 DIAGNOSIS — Z23 Encounter for immunization: Secondary | ICD-10-CM

## 2012-08-15 DIAGNOSIS — Z9884 Bariatric surgery status: Secondary | ICD-10-CM

## 2012-08-15 DIAGNOSIS — R001 Bradycardia, unspecified: Secondary | ICD-10-CM

## 2012-08-15 DIAGNOSIS — I498 Other specified cardiac arrhythmias: Secondary | ICD-10-CM

## 2012-08-15 DIAGNOSIS — Z0000 Encounter for general adult medical examination without abnormal findings: Secondary | ICD-10-CM

## 2012-08-15 DIAGNOSIS — E568 Deficiency of other vitamins: Secondary | ICD-10-CM

## 2012-08-15 DIAGNOSIS — K909 Intestinal malabsorption, unspecified: Secondary | ICD-10-CM

## 2012-08-15 DIAGNOSIS — E559 Vitamin D deficiency, unspecified: Secondary | ICD-10-CM

## 2012-08-15 DIAGNOSIS — J309 Allergic rhinitis, unspecified: Secondary | ICD-10-CM

## 2012-08-15 LAB — POCT URINALYSIS DIPSTICK
Blood, UA: NEGATIVE
Glucose, UA: NEGATIVE
Nitrite, UA: NEGATIVE
Urobilinogen, UA: NEGATIVE

## 2012-08-15 MED ORDER — AZELASTINE HCL 0.1 % NA SOLN: 1.0000 | Freq: Two times a day (BID) | NASAL | Status: DC

## 2012-08-15 NOTE — Progress Notes (Signed)
Subjective:    Patient ID: Jennifer Harrell, female    DOB: 08-22-1966, 46 y.o.   MRN: [id number]  HPI Ellinor is here for comprehensive eval.  Overall doing well but sneezing and feels fullness in L ear.   Recentley returned from [facility] where ragweed was high and she was sneezing quite a bit.  She has been taking her Vitamin D and is finished with high dose  No chest pain, no SOB   Allergies  Allergen Reactions  . Prednisone Swelling  . Sulfonamide Derivatives    Past Medical History  Diagnosis Date  . Vitamin d deficiency   . Anemia   . History of breast surgery 1983    reduction  . Fibromyalgia    Past Surgical History  Procedure Date  . Breast reduction   . Bpdd weight loss surgery 2002  . Uterine ablation   . Abdominoplasty 2005  . Thigh lift    History   Social History  . Marital Status: Married    Spouse Name: N/A    Number of Children: N/A  . Years of Education: N/A   Occupational History  . Cytogeneticist    Social History Main Topics  . Smoking status: Never Smoker   . Smokeless tobacco: Never Used  . Alcohol Use: No  . Drug Use: No  . Sexually Active: Yes -- Female partner(s)   Other Topics Concern  . Not on file   Social History Narrative  . No narrative on file   Family History  Problem Relation Age of Onset  . Cancer Maternal Grandfather     stomach  . Diabetes Mother   . Diabetes Maternal Grandfather   . Heart failure Maternal Grandfather   . Heart failure Maternal Grandmother    Patient Active Problem List  Diagnosis  . DEFICIENCY OF OTHER VITAMINS  . PREMATURE VENTRICULAR CONTRACTIONS  . BRADYCARDIA  . History of anemia  . History of fibromyalgia  . Lymphedema  . Malabsorption syndrome  . Seborrheic dermatitis of scalp  . History of gastric bypass  . S/P endometrial ablation  . History of abdominoplasty  . Vitamin d deficiency  . Sinus bradycardia  . Allergic rhinitis due to allergen   Current Outpatient  Prescriptions on File Prior to Visit  Medication Sig Dispense Refill  . Multiple Vitamin (MULTIVITAMIN) tablet Take 1 tablet by mouth daily.      Marland Kitchen azelastine (ASTELIN) 137 MCG/SPRAY nasal spray Place 1 spray into the nose 2 (two) times daily. Use in each nostril as directed  30 mL  1  . Cholecalciferol (VITAMIN D3) 50000 UNITS CAPS Take 1 capsule by mouth once a week.  8 capsule  0      Review of Systems  Respiratory: Negative.   All other systems reviewed and are negative.       Objective:   Physical Exam  Physical Exam  Nursing note and vitals reviewed.  Constitutional: She is oriented to person, place, and time. She appears well-developed and well-nourished.  HENT:  Head: Normocephalic and atraumatic.  Right Ear: Tympanic membrane and ear canal normal. No drainage. Tympanic membrane is not injected and not erythematous.  Left Ear: Tympanic membrane and ear canal normal. No drainage. Tympanic membrane is not injected and not erythematous.  Nose: Nose normal. Right sinus exhibits no maxillary sinus tenderness and no frontal sinus tenderness. Left sinus exhibits no maxillary sinus tenderness and no frontal sinus tenderness.  Mouth/Throat: Oropharynx is clear and moist. No oral  lesions. No oropharyngeal exudate.  Eyes: Conjunctivae and EOM are normal. Pupils are equal, round, and reactive to light.  Neck: Normal range of motion. Neck supple. No JVD present. Carotid bruit is not present. No mass and no thyromegaly present.  Cardiovascular: Normal rate, regular rhythm, S1 normal, S2 normal and intact distal pulses. Exam reveals no gallop and no friction rub.  No murmur heard.  Pulses:  Carotid pulses are 2+ on the right side, and 2+ on the left side.  Dorsalis pedis pulses are 2+ on the right side, and 2+ on the left side.  No carotid bruit. No LE edema  Pulmonary/Chest: Breath sounds normal. She has no wheezes. She has no rales. She exhibits no tenderness.  Abdominal: Soft. Bowel  sounds are normal. She exhibits no distension and no mass. There is no hepatosplenomegaly. There is no tenderness. There is no CVA tenderness.  Musculoskeletal: Normal range of motion.  No active synovitis to joints.  Lymphadenopathy:  She has no cervical adenopathy.  She has no axillary adenopathy.  Right: No inguinal and no supraclavicular adenopathy present.  Left: No inguinal and no supraclavicular adenopathy present.  Neurological: She is alert and oriented to person, place, and time. She has normal strength and normal reflexes. She displays no tremor. No cranial nerve deficit or sensory deficit. Coordination and gait normal.  Skin: Skin is warm and dry. No rash noted. No cyanosis. Nails show no clubbing.  Psychiatric: She has a normal mood and affect. Her speech is normal and behavior is normal. Cognition and memory are normal.          Assessment & Plan:  Health Maintenance:  Will give flu vaccine today.  UTD with mm and pap with GYN.    Allergic rhinitis  Cannot tolerate prednisone.  Will give plan astelin   Malabsorption/Vitamin Deficiency:  Will check Vit A vitamin D and zinc today  Chronic lymphedema  Sinus Bradycardia

## 2012-08-15 NOTE — Patient Instructions (Addendum)
Labs will be mailed to you 

## 2012-08-17 LAB — ZINC: Zinc: 49 ug/dL — ABNORMAL LOW (ref 60–130)

## 2012-08-18 ENCOUNTER — Encounter: Payer: Self-pay | Admitting: *Deleted

## 2012-08-18 LAB — VITAMIN A: Vitamin A (Retinoic Acid): 17 ug/dL — ABNORMAL LOW (ref 38–98)

## 2012-08-19 ENCOUNTER — Telehealth: Payer: Self-pay | Admitting: *Deleted

## 2012-08-19 NOTE — Telephone Encounter (Signed)
Left message for pt regarding zinc

## 2012-08-25 ENCOUNTER — Other Ambulatory Visit: Payer: Self-pay | Admitting: Internal Medicine

## 2012-08-25 ENCOUNTER — Telehealth: Payer: Self-pay | Admitting: Internal Medicine

## 2012-08-25 DIAGNOSIS — E509 Vitamin A deficiency, unspecified: Secondary | ICD-10-CM | POA: Insufficient documentation

## 2012-08-25 MED ORDER — VITAMIN A 8000 UNITS PO CAPS: 8000.0000 [IU] | ORAL_CAPSULE | Freq: Every day | ORAL | Status: DC

## 2012-08-25 MED ORDER — VITAMIN D3 1.25 MG (50000 UT) PO CAPS: 1.0000 | ORAL_CAPSULE | ORAL | Status: DC

## 2012-08-25 NOTE — Telephone Encounter (Signed)
Spoke with pt and informed of Vitamin A and vitamin D levels  Will give Vitamin D 50,000 units weeks for 8 weeks and will order Vitamin A 8000 units daily for 2 months.  Pt is to see me in 3 months as I need to recheck levels.    Pt voices understanding

## 2012-08-26 ENCOUNTER — Telehealth: Payer: Self-pay | Admitting: *Deleted

## 2012-08-26 NOTE — Telephone Encounter (Signed)
Pt labs mailed to her

## 2012-10-24 ENCOUNTER — Encounter: Payer: Self-pay | Admitting: Internal Medicine

## 2012-10-24 ENCOUNTER — Ambulatory Visit (INDEPENDENT_AMBULATORY_CARE_PROVIDER_SITE_OTHER): Payer: 59 | Admitting: Internal Medicine

## 2012-10-24 VITALS — BP 120/60 | HR 57 | Temp 97.8°F | Resp 16 | Wt 229.1 lb

## 2012-10-24 DIAGNOSIS — E568 Deficiency of other vitamins: Secondary | ICD-10-CM

## 2012-10-24 DIAGNOSIS — E618 Deficiency of other specified nutrient elements: Secondary | ICD-10-CM

## 2012-10-24 DIAGNOSIS — E509 Vitamin A deficiency, unspecified: Secondary | ICD-10-CM

## 2012-10-24 DIAGNOSIS — E559 Vitamin D deficiency, unspecified: Secondary | ICD-10-CM

## 2012-10-24 DIAGNOSIS — E60 Dietary zinc deficiency: Secondary | ICD-10-CM

## 2012-10-24 NOTE — Patient Instructions (Addendum)
Check labs on my chart  Take vitamins daily

## 2012-10-24 NOTE — Progress Notes (Signed)
Subjective:    Patient ID: Jennifer Harrell, female    DOB: 01-10-1966, 46 y.o.   MRN: [id number]  HPI [name] is here for follow up.  She was deficient in vitamin A, Vitamin D and zinc.  She is on her last week of high dose vitamin D.  She is also taking vitamin A and zinc supplementaion  Doing well.  Lymphedema stable    Allergies  Allergen Reactions  . Prednisone Swelling  . Sulfonamide Derivatives    Past Medical History  Diagnosis Date  . Vitamin D deficiency   . Anemia   . History of breast surgery 1983    reduction  . Fibromyalgia    Past Surgical History  Procedure Date  . Breast reduction   . Bpdd weight loss surgery 2002  . Uterine ablation   . Abdominoplasty 2005  . Thigh lift    History   Social History  . Marital Status: Married    Spouse Name: N/A    Number of Children: N/A  . Years of Education: N/A   Occupational History  . Cytogeneticist    Social History Main Topics  . Smoking status: Never Smoker   . Smokeless tobacco: Never Used  . Alcohol Use: No  . Drug Use: No  . Sexually Active: Yes -- Female partner(s)   Other Topics Concern  . Not on file   Social History Narrative  . No narrative on file   Family History  Problem Relation Age of Onset  . Cancer Maternal Grandfather     stomach  . Diabetes Mother   . Diabetes Maternal Grandfather   . Heart failure Maternal Grandfather   . Heart failure Maternal Grandmother    Patient Active Problem List  Diagnosis  . DEFICIENCY OF OTHER VITAMINS  . PREMATURE VENTRICULAR CONTRACTIONS  . BRADYCARDIA  . History of anemia  . History of fibromyalgia  . Lymphedema  . Malabsorption syndrome  . Seborrheic dermatitis of scalp  . History of gastric bypass  . S/P endometrial ablation  . History of abdominoplasty  . Vitamin d deficiency  . Sinus bradycardia  . Allergic rhinitis due to allergen  . Vitamin a deficiency   Current Outpatient Prescriptions on File Prior to Visit  Medication  Sig Dispense Refill  . azelastine (ASTELIN) 137 MCG/SPRAY nasal spray Place 1 spray into the nose 2 (two) times daily. Use in each nostril as directed  30 mL  1  . Cholecalciferol (VITAMIN D3) 50000 UNITS CAPS Take 1 capsule by mouth once a week.  8 capsule  0  . Multiple Vitamin (MULTIVITAMIN) tablet Take 1 tablet by mouth daily.      . vitamin A 8000 UNIT capsule Take 1 capsule (8,000 Units total) by mouth daily.  60 capsule  0       Review of Systems    see HPI Objective:   Physical Exam  Physical Exam  Nursing note and vitals reviewed.  Constitutional: She is oriented to person, place, and time. She appears well-developed and well-nourished.  HENT:  Head: Normocephalic and atraumatic.  Cardiovascular: Normal rate and regular rhythm. Exam reveals no gallop and no friction rub.  No murmur heard.  Pulmonary/Chest: Breath sounds normal. She has no wheezes. She has no rales.  Neurological: She is alert and oriented to person, place, and time.  Skin: Skin is warm and dry.  Psychiatric: She has a normal mood and affect. Her behavior is normal.  Assessment & Plan:  Vitamin Deficiency:  Will recheck A, D and zinc levels  Continue current meds.  May be able to decrease dose of D depending on levels  S/O gastric bypass/malabsorption   Counseled to continue multivitamin  Lymedema

## 2012-10-30 LAB — VITAMIN A: Vitamin A (Retinoic Acid): 21 ug/dL — ABNORMAL LOW (ref 38–98)

## 2012-11-02 ENCOUNTER — Telehealth: Payer: Self-pay | Admitting: Internal Medicine

## 2012-11-07 ENCOUNTER — Telehealth: Payer: Self-pay | Admitting: Internal Medicine

## 2012-11-07 NOTE — Telephone Encounter (Signed)
Left message on voice mail for pt to return call regarding labwork

## 2012-11-08 ENCOUNTER — Telehealth: Payer: Self-pay | Admitting: Internal Medicine

## 2012-11-08 NOTE — Telephone Encounter (Signed)
Left message on pts cell phone to call regarding lab results

## 2012-11-09 ENCOUNTER — Telehealth: Payer: Self-pay | Admitting: Internal Medicine

## 2012-11-09 NOTE — Telephone Encounter (Signed)
Left message on pt mobile to call office regarding lab results

## 2012-11-10 ENCOUNTER — Telehealth: Payer: Self-pay | Admitting: Internal Medicine

## 2012-11-10 MED ORDER — VITAMIN D3 1.25 MG (50000 UT) PO CAPS: 1.0000 | ORAL_CAPSULE | ORAL | Status: DC

## 2012-11-10 MED ORDER — VITAMIN A 15000 UNITS PO TABS: 1.0000 | ORAL_TABLET | Freq: Every day | ORAL | Status: DC

## 2012-11-10 NOTE — Telephone Encounter (Signed)
Pt states she is returning Dr. Monika Salk call... It will be better to call pt on her cell number [phone].Marland Kitchen Pt will be in and out of meetings today.. Ad

## 2012-11-10 NOTE — Telephone Encounter (Signed)
Spoke with pt and informed of lab results  Will continue Vitamin D at 50,000 units for 8 weeks  Increase Vitamin A to 15,000 units daily.    She has follow up appt in February.  Counseled to keep appt with me

## 2012-11-11 ENCOUNTER — Telehealth: Payer: Self-pay | Admitting: *Deleted

## 2012-11-11 ENCOUNTER — Encounter: Payer: Self-pay | Admitting: *Deleted

## 2012-11-11 NOTE — Telephone Encounter (Signed)
Results mailed to pt home address.

## 2013-01-16 ENCOUNTER — Ambulatory Visit (INDEPENDENT_AMBULATORY_CARE_PROVIDER_SITE_OTHER): Payer: 59 | Admitting: Internal Medicine

## 2013-01-16 ENCOUNTER — Encounter: Payer: Self-pay | Admitting: Internal Medicine

## 2013-01-16 VITALS — BP 107/72 | HR 86 | Temp 98.2°F | Resp 18 | Wt 235.0 lb

## 2013-01-16 DIAGNOSIS — E569 Vitamin deficiency, unspecified: Secondary | ICD-10-CM

## 2013-01-16 DIAGNOSIS — K921 Melena: Secondary | ICD-10-CM

## 2013-01-16 NOTE — Progress Notes (Signed)
Subjective:    Patient ID: Jennifer Harrell, female    DOB: 07-16-1966, 47 y.o.   MRN: [id number]  HPI Jennifer Harrell is here for follow up.  She has just finished her high dose vitamin D for an 8 week period and she is taking 10,000 units of vitamin A along with her multivitamin  She reports intermittant visible blood in stool.  She had a polyp removed at [facility] she thinks around 2003. She reports she had a polyp at that time  Allergies  Allergen Reactions  . Prednisone Swelling  . Sulfonamide Derivatives    Past Medical History  Diagnosis Date  . Vitamin D deficiency   . Anemia   . History of breast surgery 1983    reduction  . Fibromyalgia    Past Surgical History  Procedure Laterality Date  . Breast reduction    . Bpdd weight loss surgery  2002  . Uterine ablation    . Abdominoplasty  2005  . Thigh lift     History   Social History  . Marital Status: Married    Spouse Name: N/A    Number of Children: N/A  . Years of Education: N/A   Occupational History  . Cytogeneticist    Social History Main Topics  . Smoking status: Never Smoker   . Smokeless tobacco: Never Used  . Alcohol Use: No  . Drug Use: No  . Sexually Active: Yes -- Female partner(s)   Other Topics Concern  . Not on file   Social History Narrative  . No narrative on file   Family History  Problem Relation Age of Onset  . Cancer Maternal Grandfather     stomach  . Diabetes Mother   . Diabetes Maternal Grandfather   . Heart failure Maternal Grandfather   . Heart failure Maternal Grandmother    Patient Active Problem List  Diagnosis  . DEFICIENCY OF OTHER VITAMINS  . PREMATURE VENTRICULAR CONTRACTIONS  . BRADYCARDIA  . History of anemia  . History of fibromyalgia  . Lymphedema  . Malabsorption syndrome  . Seborrheic dermatitis of scalp  . History of gastric bypass  . S/P endometrial ablation  . History of abdominoplasty  . Vitamin d deficiency  . Sinus bradycardia  . Allergic  rhinitis due to allergen  . Vitamin a deficiency  . Zinc deficiency   Current Outpatient Prescriptions on File Prior to Visit  Medication Sig Dispense Refill  . azelastine (ASTELIN) 137 MCG/SPRAY nasal spray Place 1 spray into the nose 2 (two) times daily. Use in each nostril as directed  30 mL  1  . Multiple Vitamin (MULTIVITAMIN) tablet Take 1 tablet by mouth daily.      . Vitamin A 96295 UNITS TABS Take 1 tablet (15,000 Units total) by mouth daily.  30 each  1  . Zinc 15 MG CAPS Take 1 capsule by mouth daily.      . Cholecalciferol (VITAMIN D3) 50000 UNITS CAPS Take 1 capsule by mouth once a week.  8 capsule  0   No current facility-administered medications on file prior to visit.           Review of Systems    see HPI Objective:   Physical Exam Physical Exam  Nursing note and vitals reviewed.  Constitutional: She is oriented to person, place, and time. She appears well-developed and well-nourished.  HENT:  Head: Normocephalic and atraumatic.  Cardiovascular: Normal rate and regular rhythm. Exam reveals no gallop and no friction  rub.  No murmur heard.  Pulmonary/Chest: Breath sounds normal. She has no wheezes. She has no rales.  Neurological: She is alert and oriented to person, place, and time.  Skin: Skin is warm and dry.  Psychiatric: She has a normal mood and affect. Her behavior is normal.             Assessment & Plan:  Vitamin Deficinecy  Will check A , D and zinc today  Reported blood in stool   Will refer to [facility]

## 2013-01-19 ENCOUNTER — Telehealth: Payer: Self-pay | Admitting: *Deleted

## 2013-01-19 NOTE — Telephone Encounter (Signed)
Called pt to notify her we will need to re-collect her blood left message

## 2013-01-20 ENCOUNTER — Telehealth: Payer: Self-pay | Admitting: *Deleted

## 2013-01-20 NOTE — Telephone Encounter (Signed)
Pt will come on Monday for redraw of zinc

## 2013-01-22 ENCOUNTER — Telehealth: Payer: Self-pay | Admitting: Internal Medicine

## 2013-01-22 NOTE — Telephone Encounter (Signed)
Spoke with pt.  And informed of vitamin D. Will get opinion of Dr. Talmage Nap regarding maintenance dose.  Pt to make appt.  Pt voices understanidng

## 2013-02-26 ENCOUNTER — Encounter: Payer: Self-pay | Admitting: Internal Medicine

## 2013-02-26 DIAGNOSIS — R195 Other fecal abnormalities: Secondary | ICD-10-CM | POA: Insufficient documentation

## 2013-03-13 ENCOUNTER — Encounter: Payer: Self-pay | Admitting: *Deleted

## 2013-06-20 ENCOUNTER — Ambulatory Visit (INDEPENDENT_AMBULATORY_CARE_PROVIDER_SITE_OTHER): Payer: 59 | Admitting: General Surgery

## 2013-06-20 ENCOUNTER — Encounter (INDEPENDENT_AMBULATORY_CARE_PROVIDER_SITE_OTHER): Payer: Self-pay | Admitting: General Surgery

## 2013-06-20 VITALS — BP 138/70 | HR 64 | Temp 98.8°F | Resp 16 | Ht 67.25 in | Wt 233.0 lb

## 2013-06-20 DIAGNOSIS — K648 Other hemorrhoids: Secondary | ICD-10-CM | POA: Insufficient documentation

## 2013-06-20 DIAGNOSIS — IMO0001 Reserved for inherently not codable concepts without codable children: Secondary | ICD-10-CM

## 2013-06-20 NOTE — Patient Instructions (Addendum)

## 2013-06-20 NOTE — Progress Notes (Signed)
Chief Complaint  Patient presents with  . New Evaluation    eval bleeding hems    HISTORY: Jennifer Harrell is a 47 y.o. female who presents to the office with rectal bleeding.  Other symptoms include occasional abd cramping.  This had been occurring for about a year (every 3-4 weeks).  She has tried nothing in the past.  Nothing makes the symptoms worse.   It is intermittent in nature.  Her bowel habits are regular and her bowel movements are loose.  Her fiber intake is dietary.  Her last colonoscopy was in April and was normal except for internal hemorrhoids.  She denies prolapsing tissue.     Past Medical History  Diagnosis Date  . Vitamin D deficiency   . Anemia   . History of breast surgery 1983    reduction  . Fibromyalgia       Past Surgical History  Procedure Laterality Date  . Breast reduction    . Bpdd weight loss surgery  2002  . Uterine ablation    . Abdominoplasty  2005  . Thigh lift          Current Outpatient Prescriptions  Medication Sig Dispense Refill  . Cholecalciferol (VITAMIN D3) 50000 UNITS CAPS Take 1 capsule by mouth once a week.  8 capsule  0  . Multiple Vitamin (MULTIVITAMIN) tablet Take 1 tablet by mouth daily.      Marland Kitchen spironolactone (ALDACTONE) 50 MG tablet Take 50 mg by mouth daily. Pt unsure of actual dosage       No current facility-administered medications for this visit.      Allergies  Allergen Reactions  . Prednisone Swelling  . Sulfonamide Derivatives       Family History  Problem Relation Age of Onset  . Cancer Maternal Grandfather     stomach  . Diabetes Mother   . Diabetes Maternal Grandfather   . Heart failure Maternal Grandfather   . Heart failure Maternal Grandmother     History   Social History  . Marital Status: Married    Spouse Name: N/A    Number of Children: N/A  . Years of Education: N/A   Occupational History  . Cytogeneticist    Social History Main Topics  . Smoking status: Never Smoker   . Smokeless  tobacco: Never Used  . Alcohol Use: No  . Drug Use: No  . Sexually Active: Yes -- Female partner(s)   Other Topics Concern  . None   Social History Narrative  . None      REVIEW OF SYSTEMS - PERTINENT POSITIVES ONLY: Review of Systems - General ROS: negative for - chills, fever or weight loss Hematological and Lymphatic ROS: negative for - bleeding problems, blood clots or bruising Respiratory ROS: no cough, shortness of breath, or wheezing Cardiovascular ROS: no chest pain or dyspnea on exertion Gastrointestinal ROS: positive for - abdominal pain negative for - change in bowel habits, constipation, nausea/vomiting or stool incontinence Genito-Urinary ROS: no dysuria, trouble voiding, or hematuria  EXAM: Filed Vitals:   06/20/13 0858  BP: 138/70  Pulse: 64  Temp: 98.8 F (37.1 C)  Resp: 16    General appearance: alert and cooperative Resp: clear to auscultation bilaterally Cardio: regular rate and rhythm GI: soft, non-tender; bowel sounds normal; no masses,  no organomegaly   Procedure: Anoscopy Surgeon: Maisie Fus Diagnosis: rectal bleeding  Assistant: Christella Scheuermann After the risks and benefits were explained, verbal consent was obtained for above procedure  Anesthesia: none  Findings: grade 1 internal hemorrhoids    ASSESSMENT AND PLAN: Jennifer Harrell is a 47 y.o. F with rectal bleeding.  She is s/p a duodenal switch procedure that makes her have loose stools and occasional abd cramping.  On exam she has some inflamed grade 1 internal hemorrhoids.  These are most likely irritated by her diarrhea.  I have asked her to try imodium and/or fiber supplements to help slow things down.  I think this will help with her irritation.  If that does not work, we could try a steroid suppository.  Her allergy to prednisone seems to be towards systemic steroids with the effect of leg swelling.  So the suppository may not cause the same problems.  She will call the office if she would like to  try this or if her symptoms change.      Vanita Panda, MD Colon and Rectal Surgery / General Surgery [facility]      Visit Diagnoses: 1. Prolapsed internal hemorrhoids, grade 1   2. Internal bleeding hemorrhoids     Primary Care Physician: Levon Hedger, MD

## 2013-07-25 ENCOUNTER — Emergency Department (HOSPITAL_BASED_OUTPATIENT_CLINIC_OR_DEPARTMENT_OTHER)
Admission: EM | Admit: 2013-07-25 | Discharge: 2013-07-25 | Disposition: A | Discharge status: Home / Self Care | Payer: 59 | Attending: Emergency Medicine | Admitting: Emergency Medicine

## 2013-07-25 ENCOUNTER — Encounter (HOSPITAL_BASED_OUTPATIENT_CLINIC_OR_DEPARTMENT_OTHER): Payer: Self-pay

## 2013-07-25 DIAGNOSIS — S0990XA Unspecified injury of head, initial encounter: Secondary | ICD-10-CM

## 2013-07-25 DIAGNOSIS — Z791 Long term (current) use of non-steroidal anti-inflammatories (NSAID): Secondary | ICD-10-CM | POA: Insufficient documentation

## 2013-07-25 DIAGNOSIS — Z9889 Other specified postprocedural states: Secondary | ICD-10-CM | POA: Insufficient documentation

## 2013-07-25 DIAGNOSIS — W2209XA Striking against other stationary object, initial encounter: Secondary | ICD-10-CM | POA: Insufficient documentation

## 2013-07-25 DIAGNOSIS — IMO0001 Reserved for inherently not codable concepts without codable children: Secondary | ICD-10-CM | POA: Insufficient documentation

## 2013-07-25 DIAGNOSIS — Y939 Activity, unspecified: Secondary | ICD-10-CM | POA: Insufficient documentation

## 2013-07-25 DIAGNOSIS — E559 Vitamin D deficiency, unspecified: Secondary | ICD-10-CM | POA: Insufficient documentation

## 2013-07-25 DIAGNOSIS — Y929 Unspecified place or not applicable: Secondary | ICD-10-CM | POA: Insufficient documentation

## 2013-07-25 DIAGNOSIS — Z862 Personal history of diseases of the blood and blood-forming organs and certain disorders involving the immune mechanism: Secondary | ICD-10-CM | POA: Insufficient documentation

## 2013-07-25 MED ORDER — NAPROXEN 500 MG PO TABS: 500.0000 mg | ORAL_TABLET | Freq: Two times a day (BID) | ORAL | Status: DC

## 2013-07-25 MED ORDER — TRAMADOL HCL 50 MG PO TABS: 50.0000 mg | ORAL_TABLET | Freq: Four times a day (QID) | ORAL | Status: DC | PRN
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
## 2013-10-05 ENCOUNTER — Other Ambulatory Visit: Payer: Self-pay

## 2013-11-15 ENCOUNTER — Ambulatory Visit (INDEPENDENT_AMBULATORY_CARE_PROVIDER_SITE_OTHER): Payer: 59 | Admitting: Internal Medicine

## 2013-11-15 ENCOUNTER — Encounter: Payer: Self-pay | Admitting: *Deleted

## 2013-11-15 ENCOUNTER — Encounter: Payer: Self-pay | Admitting: Internal Medicine

## 2013-11-15 VITALS — BP 127/75 | HR 81 | Temp 100.5°F | Resp 18 | Wt 228.0 lb

## 2013-11-15 DIAGNOSIS — R059 Cough, unspecified: Secondary | ICD-10-CM

## 2013-11-15 DIAGNOSIS — R6889 Other general symptoms and signs: Secondary | ICD-10-CM

## 2013-11-15 DIAGNOSIS — R05 Cough: Secondary | ICD-10-CM

## 2013-11-15 DIAGNOSIS — J111 Influenza due to unidentified influenza virus with other respiratory manifestations: Secondary | ICD-10-CM

## 2013-11-15 MED ORDER — HYDROCOD POLST-CHLORPHEN POLST 10-8 MG/5ML PO LQCR: 5.0000 mL | Freq: Two times a day (BID) | ORAL | Status: DC | PRN

## 2013-11-15 MED ORDER — OSELTAMIVIR PHOSPHATE 75 MG PO CAPS: 75.0000 mg | ORAL_CAPSULE | Freq: Two times a day (BID) | ORAL | Status: DC

## 2013-11-15 NOTE — Progress Notes (Signed)
Subjective:    Patient ID: Jennifer Harrell, female    DOB: [birth year], 47 y.o.   MRN: [id number]  HPI Jennifer Harrell is here for acute visit.   36 hours sore throat fever,  Cough.  No chest pain No SOB  Pt had flu vaccine   Allergies  Allergen Reactions  . Prednisone Swelling  . Sulfonamide Derivatives    Past Medical History  Diagnosis Date  . Vitamin D deficiency   . Anemia   . History of breast surgery 1983    reduction  . Fibromyalgia    Past Surgical History  Procedure Laterality Date  . Breast reduction    . Bpdd weight loss surgery  2002  . Uterine ablation    . Abdominoplasty  2005  . Thigh lift     History   Social History  . Marital Status: Married    Spouse Name: N/A    Number of Children: N/A  . Years of Education: N/A   Occupational History  . Cytogeneticist    Social History Main Topics  . Smoking status: Never Smoker   . Smokeless tobacco: Never Used  . Alcohol Use: No  . Drug Use: No  . Sexual Activity: Yes    Partners: Male   Other Topics Concern  . Not on file   Social History Narrative  . No narrative on file   Family History  Problem Relation Age of Onset  . Cancer Maternal Grandfather     stomach  . Diabetes Mother   . Diabetes Maternal Grandfather   . Heart failure Maternal Grandfather   . Heart failure Maternal Grandmother    Patient Active Problem List   Diagnosis Date Noted  . Internal bleeding hemorrhoids 06/20/2013  . Guaiac positive stools 02/26/2013  . Zinc deficiency 10/24/2012  . Vitamin a deficiency 08/25/2012  . Sinus bradycardia 08/15/2012  . Allergic rhinitis due to allergen 08/15/2012  . Vitamin d deficiency 05/12/2012  . History of anemia 05/10/2012  . History of fibromyalgia 05/10/2012  . Lymphedema 05/10/2012  . Malabsorption syndrome 05/10/2012  . Seborrheic dermatitis of scalp 05/10/2012  . 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 S/P endometrial ablation 05/10/2012  . History of abdominoplasty  05/10/2012  . DEFICIENCY OF OTHER VITAMINS 02/05/2010  . PREMATURE VENTRICULAR CONTRACTIONS 02/05/2010  . BRADYCARDIA 02/05/2010   Current Outpatient Prescriptions on File Prior to Visit  Medication Sig Dispense Refill  . Multiple Vitamin (MULTIVITAMIN) tablet Take 1 tablet by mouth daily.      . naproxen (NAPROSYN) 500 MG tablet Take 1 tablet (500 mg total) by mouth 2 (two) times daily with a meal.  30 tablet  0  . traMADol (ULTRAM) 50 MG tablet Take 1 tablet (50 mg total) by mouth every 6 (six) hours as needed for pain.  15 tablet  0   No current facility-administered medications on file prior to visit.       Review of Systems See HPI    Objective:   Physical Exam Physical Exam  INfluenza pos Constitutional: She is oriented to person, place, and time. She appears well-developed and well-nourished. She is cooperative.  HENT:  Head: Normocephalic and atraumatic.  Right Ear: A middle ear effusion is present.  Left Ear: A middle ear effusion is present.  Nose: Mucosal edema present. Right sinus exhibits maxillary sinus tenderness. Left sinus exhibits maxillary sinus tenderness.  Mouth/Throat: Posterior oropharyngeal erythema present.  No exudates Serous effusion bilaterally  Eyes: Conjunctivae and EOM are normal.  Pupils are equal, round, and reactive to light.  Neck: Neck supple. Carotid bruit is not present. No mass present.  Cardiovascular: Regular rhythm, normal heart sounds, intact distal pulses and normal pulses. Exam reveals no gallop and no friction rub.  No murmur heard.  Pulmonary/Chest: Breath sounds normal. She has no wheezes. She has no rhonchi. She has no rales.  Neurological: She is alert and oriented to person, place, and time.  Skin: Skin is warm and dry. No abrasion, no bruising, no ecchymosis and no rash noted. No cyanosis. Nails show no clubbing.  Psychiatric: She has a normal mood and affect. Her speech is normal and behavior is normal.        Assessment  & Plan:  Influenza  Tamiflu bid for 5 days  Cough  Tussionex  1 tsp q12h  Myalgias  Tylenol prn  Call if not better

## 2013-11-15 NOTE — Patient Instructions (Signed)
See me if not better 

## 2013-11-16 ENCOUNTER — Telehealth: Payer: Self-pay | Admitting: *Deleted

## 2013-11-16 NOTE — Telephone Encounter (Signed)
Courtesy call

## 2013-11-28 ENCOUNTER — Other Ambulatory Visit: Payer: Self-pay | Admitting: *Deleted

## 2013-11-28 ENCOUNTER — Telehealth: Payer: Self-pay | Admitting: *Deleted

## 2013-11-28 MED ORDER — HYDROCODONE-HOMATROPINE 5-1.5 MG/5ML PO SYRP: 5.0000 mL | ORAL_SOLUTION | Freq: Three times a day (TID) | ORAL | Status: DC | PRN

## 2013-11-28 NOTE — Telephone Encounter (Signed)
Pt calls stating that she is still having a cough and nausea despite finishing her tamiflu from 12/17. Pt also reports intermittent fever of 99. Advised pt that this has been common with this type of virus as well as several other virus she may have been exposed to.pt requesting another cough medication. Ordered Hycodan per Dr DDS V/O pt will send SO to pick up RX. Advised pt to make a F/U appt if cough continues and to seek emergency care if sx worsen or fever becomes high. Pt voiced understanding will call pt in the am to check on sx

## 2013-12-07 ENCOUNTER — Telehealth: Payer: Self-pay | Admitting: *Deleted

## 2013-12-07 NOTE — Telephone Encounter (Signed)
Returning pt call she states that her cough is still continuing LVM message to return cal

## 2013-12-11 ENCOUNTER — Telehealth: Payer: Self-pay | Admitting: *Deleted

## 2013-12-11 ENCOUNTER — Ambulatory Visit (INDEPENDENT_AMBULATORY_CARE_PROVIDER_SITE_OTHER): Payer: 59 | Admitting: Internal Medicine

## 2013-12-11 ENCOUNTER — Ambulatory Visit (HOSPITAL_BASED_OUTPATIENT_CLINIC_OR_DEPARTMENT_OTHER)
Admission: RE | Admit: 2013-12-11 | Discharge: 2013-12-11 | Disposition: A | Discharge status: Home / Self Care | Payer: 59 | Source: Ambulatory Visit | Attending: Internal Medicine | Admitting: Internal Medicine

## 2013-12-11 ENCOUNTER — Encounter: Payer: Self-pay | Admitting: *Deleted

## 2013-12-11 ENCOUNTER — Encounter: Payer: Self-pay | Admitting: Internal Medicine

## 2013-12-11 VITALS — BP 127/78 | HR 71 | Resp 16 | Ht 67.0 in | Wt 231.0 lb

## 2013-12-11 DIAGNOSIS — R059 Cough, unspecified: Secondary | ICD-10-CM

## 2013-12-11 DIAGNOSIS — R05 Cough: Secondary | ICD-10-CM

## 2013-12-11 DIAGNOSIS — R21 Rash and other nonspecific skin eruption: Secondary | ICD-10-CM

## 2013-12-11 DIAGNOSIS — J09X2 Influenza due to identified novel influenza A virus with other respiratory manifestations: Secondary | ICD-10-CM

## 2013-12-11 MED ORDER — HYDROCODONE-HOMATROPINE 5-1.5 MG/5ML PO SYRP: 5.0000 mL | ORAL_SOLUTION | Freq: Three times a day (TID) | ORAL | Status: DC | PRN

## 2013-12-11 MED ORDER — ALBUTEROL SULFATE HFA 108 (90 BASE) MCG/ACT IN AERS: 2.0000 | INHALATION_SPRAY | Freq: Four times a day (QID) | RESPIRATORY_TRACT | Status: DC | PRN

## 2013-12-11 NOTE — Patient Instructions (Signed)
To Whom It may concern   Jennifer Harrell is to work one half days on Monday and Tuesday

## 2013-12-11 NOTE — Telephone Encounter (Signed)
error 

## 2013-12-11 NOTE — Progress Notes (Signed)
Subjective:    Patient ID: Jennifer Harrell, female    DOB: 08-06-1966, 48 y.o.   MRN: [id number]  HPI  Jennifer Harrell is here for follow up.  She was diagnosed with influenza A last visit and has completed Tamiflu.  She continues to have cough and lung congestion  NO documented fever or chest pain.  NO SOB  She did have some diarrhea over the weekend but no abd pain  No vomiting  She also reports darkened skin discoloration on mid lower back along vvertebrae  Allergies  Allergen Reactions  . Prednisone Swelling  . Sulfonamide Derivatives    Past Medical History  Diagnosis Date  . Vitamin D deficiency   . Anemia   . History of breast surgery 1983    reduction  . Fibromyalgia    Past Surgical History  Procedure Laterality Date  . Breast reduction    . Bpdd weight loss surgery  2002  . Uterine ablation    . Abdominoplasty  2005  . Thigh lift     History   Social History  . Marital Status: Married    Spouse Name: N/A    Number of Children: N/A  . Years of Education: N/A   Occupational History  . Secondary school teacher    Social History Main Topics  . Smoking status: Never Smoker   . Smokeless tobacco: Never Used  . Alcohol Use: No  . Drug Use: No  . Sexual Activity: Yes    Partners: Male   Other Topics Concern  . Not on file   Social History Narrative  . No narrative on file   Family History  Problem Relation Age of Onset  . Cancer Maternal Grandfather     stomach  . Diabetes Mother   . Diabetes Maternal Grandfather   . Heart failure Maternal Grandfather   . Heart failure Maternal Grandmother    Patient Active Problem List   Diagnosis Date Noted  . Internal bleeding hemorrhoids 06/20/2013  . Guaiac positive stools 02/26/2013  . Zinc deficiency 10/24/2012  . Vitamin a deficiency 08/25/2012  . Sinus bradycardia 08/15/2012  . Allergic rhinitis due to allergen 08/15/2012  . Vitamin d deficiency 05/12/2012  . History of anemia 05/10/2012  . History of  fibromyalgia 05/10/2012  . Lymphedema 05/10/2012  . Malabsorption syndrome 05/10/2012  . Seborrheic dermatitis of scalp 05/10/2012  . 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 S/P endometrial ablation 05/10/2012  . History of abdominoplasty 05/10/2012  . DEFICIENCY OF OTHER VITAMINS 02/05/2010  . PREMATURE VENTRICULAR CONTRACTIONS 02/05/2010  . BRADYCARDIA 02/05/2010   Current Outpatient Prescriptions on File Prior to Visit  Medication Sig Dispense Refill  . chlorpheniramine-HYDROcodone (TUSSIONEX PENNKINETIC ER) 10-8 MG/5ML LQCR Take 5 mLs by mouth every 12 (twelve) hours as needed for cough.  115 mL  0  . HYDROcodone-homatropine (HYCODAN) 5-1.5 MG/5ML syrup Take 5 mLs by mouth every 8 (eight) hours as needed for cough.  120 mL  0  . Multiple Vitamin (MULTIVITAMIN) tablet Take 1 tablet by mouth daily.      . naproxen (NAPROSYN) 500 MG tablet Take 1 tablet (500 mg total) by mouth 2 (two) times daily with a meal.  30 tablet  0  . oseltamivir (TAMIFLU) 75 MG capsule Take 1 capsule (75 mg total) by mouth 2 (two) times daily.  10 capsule  0  . traMADol (ULTRAM) 50 MG tablet Take 1 tablet (50 mg total) by mouth every 6 (six) hours as needed for pain.  15 tablet  0   No current facility-administered medications on file prior to visit.      Review of Systems See HPI    Objective:   Physical Exam  Physical Exam  Nursing note and vitals reviewed.  Constitutional: She is oriented to person, place, and time. She appears well-developed and well-nourished.  HENT:  Head: Normocephalic and atraumatic.  Cardiovascular: Normal rate and regular rhythm. Exam reveals no gallop and no friction rub.  No murmur heard.  Pulmonary/Chest: Breath sounds normal. She has no wheezes. She has no rales.  Neurological: She is alert and oriented to person, place, and time.  Skin: Skin is warm and dry.  She does have hyperpigmentation midline of lower lumar and sacral area following vertebral line Psychiatric:  She has a normal mood and affect. Her behavior is normal.            Assessment & Plan:  Influenza:  Seh cannot tolerate prednisone.  CXR neg for pneumonia  Will give hycodan and albuterol  Bid to tid  Skin rash  I offered referral to dermatologist but pt declines at this time.  She is advised to call if skin rash recurs and will need referral at that time.  She voices understanding  See me as needed

## 2014-02-09 ENCOUNTER — Other Ambulatory Visit: Payer: Self-pay | Admitting: Obstetrics and Gynecology

## 2014-04-25 ENCOUNTER — Ambulatory Visit (INDEPENDENT_AMBULATORY_CARE_PROVIDER_SITE_OTHER): Payer: 59 | Admitting: Internal Medicine

## 2014-04-25 ENCOUNTER — Encounter: Payer: Self-pay | Admitting: Internal Medicine

## 2014-04-25 VITALS — BP 124/71 | HR 64 | Temp 98.5°F | Resp 18 | Ht 67.5 in | Wt 232.0 lb

## 2014-04-25 DIAGNOSIS — H1010 Acute atopic conjunctivitis, unspecified eye: Secondary | ICD-10-CM

## 2014-04-25 DIAGNOSIS — J309 Allergic rhinitis, unspecified: Secondary | ICD-10-CM

## 2014-04-25 DIAGNOSIS — J329 Chronic sinusitis, unspecified: Secondary | ICD-10-CM

## 2014-04-25 DIAGNOSIS — H1045 Other chronic allergic conjunctivitis: Secondary | ICD-10-CM

## 2014-04-25 MED ORDER — AZELASTINE HCL 0.05 % OP SOLN
1.0000 [drp] | Freq: Two times a day (BID) | OPHTHALMIC | Status: DC
Start: 2014-04-25 — End: 2014-09-18

## 2014-04-25 MED ORDER — AZITHROMYCIN 250 MG PO TABS: ORAL_TABLET | ORAL | Status: DC

## 2014-04-25 NOTE — Progress Notes (Signed)
Subjective:    Patient ID: Jennifer Harrell, female    DOB: [birth year], 48 y.o.   MRN: [id number]  HPI  Jennifer Harrell is here for acute visit.    She has had significant allergy symptoms for the last month.  Nasal congestion , eyes red and itching.  Itchy nose  Green nasal discharge now no fever some maxillary sinus pain  Now with hoarseness .  She has tried Zyrtec and multiple OTC antihistamines.     Allergies  Allergen Reactions  . Prednisone Swelling  . Sulfonamide Derivatives    Past Medical History  Diagnosis Date  . Vitamin D deficiency   . Anemia   . History of breast surgery 1983    reduction  . Fibromyalgia    Past Surgical History  Procedure Laterality Date  . Breast reduction    . Bpdd weight loss surgery  2002  . Uterine ablation    . Abdominoplasty  2005  . Thigh lift     History   Social History  . Marital Status: Married    Spouse Name: N/A    Number of Children: N/A  . Years of Education: N/A   Occupational History  . Secondary school teacher    Social History Main Topics  . Smoking status: Never Smoker   . Smokeless tobacco: Never Used  . Alcohol Use: No  . Drug Use: No  . Sexual Activity: Yes    Partners: Male   Other Topics Concern  . Not on file   Social History Narrative  . No narrative on file   Family History  Problem Relation Age of Onset  . Cancer Maternal Grandfather     stomach  . Diabetes Mother   . Diabetes Maternal Grandfather   . Heart failure Maternal Grandfather   . Heart failure Maternal Grandmother    Patient Active Problem List   Diagnosis Date Noted  . Internal bleeding hemorrhoids 06/20/2013  . Guaiac positive stools 02/26/2013  . Zinc deficiency 10/24/2012  . Vitamin a deficiency 08/25/2012  . Sinus bradycardia 08/15/2012  . Allergic rhinitis due to allergen 08/15/2012  . Vitamin d deficiency 05/12/2012  . History of anemia 05/10/2012  . History of fibromyalgia 05/10/2012  . Lymphedema 05/10/2012  .  Malabsorption syndrome 05/10/2012  . Seborrheic dermatitis of scalp 05/10/2012  . 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 S/P endometrial ablation 05/10/2012  . History of abdominoplasty 05/10/2012  . DEFICIENCY OF OTHER VITAMINS 02/05/2010  . PREMATURE VENTRICULAR CONTRACTIONS 02/05/2010  . BRADYCARDIA 02/05/2010   Current Outpatient Prescriptions on File Prior to Visit  Medication Sig Dispense Refill  . Multiple Vitamin (MULTIVITAMIN) tablet Take 1 tablet by mouth daily.      . naproxen (NAPROSYN) 500 MG tablet Take 1 tablet (500 mg total) by mouth 2 (two) times daily with a meal.  30 tablet  0  . Vitamin D, Ergocalciferol, (DRISDOL) 50000 UNITS CAPS capsule       . albuterol (PROVENTIL HFA;VENTOLIN HFA) 108 (90 BASE) MCG/ACT inhaler Inhale 2 puffs into the lungs every 6 (six) hours as needed for wheezing or shortness of breath.  1 Inhaler  0  . traMADol (ULTRAM) 50 MG tablet Take 1 tablet (50 mg total) by mouth every 6 (six) hours as needed for pain.  15 tablet  0   No current facility-administered medications on file prior to visit.      Review of Systems See HPI    Objective:   Physical Exam Physical Exam  Nursing note and vitals reviewed.  Constitutional: She is oriented to person, place, and time. She appears well-developed and well-nourished.  HENT:  Head: Normocephalic and atraumatic.  Eyes slight conjunctival redness Cardiovascular: Normal rate and regular rhythm. Exam reveals no gallop and no friction rub.  No murmur heard.  Pulmonary/Chest: Breath sounds normal. She has no wheezes. She has no rales.  Neurological: She is alert and oriented to person, place, and time.  Skin: Skin is warm and dry.  Psychiatric: She has a normal mood and affect. Her behavior is normal.          Assessment & Plan:  Allergic rhinitis.  OTC Nasacort for the next 4 weeks    Depo medrol 160 mg givne in office  Allergic conjunctibviits  Optivar 1 gtt OU bid  Sinusitis  If not  better in 48 hours ok to use Z-pak    See me if not improved

## 2014-04-25 NOTE — Patient Instructions (Addendum)
Take Nasacort over the counter  1 spray each nostril twice a day for 2 days then decrease to once a day for 4 weeks  Allegra 24 hour tablet daily  [facility] to [facility] medication  See me if not better

## 2014-06-04 ENCOUNTER — Emergency Department
Admission: EM | Admit: 2014-06-04 | Discharge: 2014-06-04 | Disposition: A | Discharge status: Home / Self Care | Payer: 59 | Source: Home / Self Care | Attending: Emergency Medicine | Admitting: Emergency Medicine

## 2014-06-04 ENCOUNTER — Encounter: Payer: Self-pay | Admitting: Emergency Medicine

## 2014-06-04 DIAGNOSIS — R300 Dysuria: Secondary | ICD-10-CM

## 2014-06-04 LAB — POCT URINALYSIS DIP (MANUAL ENTRY)
Bilirubin, UA: NEGATIVE
Ketones, POC UA: NEGATIVE
Nitrite, UA: POSITIVE
SPEC GRAV UA: 1.015 (ref 1.005–1.03)
Urobilinogen, UA: 1 (ref 0–1)
pH, UA: 5.5 (ref 5–8)

## 2014-06-04 MED ORDER — CIPROFLOXACIN HCL 500 MG PO TABS: 500.0000 mg | ORAL_TABLET | Freq: Two times a day (BID) | ORAL | Status: DC

## 2014-06-04 NOTE — ED Notes (Signed)
Martesha complains of painful and frequent urination for 2 days. Denies fever, chills or sweats.

## 2014-06-04 NOTE — ED Provider Notes (Signed)
CSN: [id number]     Arrival date & time 06/04/14  1353 History   First MD Initiated Contact with Patient 06/04/14 1400     Chief Complaint  Patient presents with  . Dysuria   (Consider location/radiation/quality/duration/timing/severity/associated sxs/prior Treatment) HPI Alyza is a 48 y.o. female who presents today with UTI symptoms for 2 days.  She has a history of UTI's and this feels very similar to previous episodes. + dysuria (improves with Azo) + frequency + urgency No hematuria No vaginal discharge No fevers +/- chills + lower abdominal pain +/- back pain No fatigue     Past Medical History  Diagnosis Date  . Vitamin D deficiency   . Anemia   . History of breast surgery 1983    reduction  . Fibromyalgia    Past Surgical History  Procedure Laterality Date  . Breast reduction    . Bpdd weight loss surgery  2002  . Uterine ablation    . Abdominoplasty  2005  . Thigh lift     Family History  Problem Relation Age of Onset  . Cancer Maternal Grandfather     stomach  . Diabetes Mother   . Diabetes Maternal Grandfather   . Heart failure Maternal Grandfather   . Heart failure Maternal Grandmother    History  Substance Use Topics  . Smoking status: Never Smoker   . Smokeless tobacco: Never Used  . Alcohol Use: No   OB History   Grav Para Term Preterm Abortions TAB SAB Ect Mult Living   1 1 1       1      Review of Systems  All other systems reviewed and are negative.   Allergies  Prednisone and Sulfonamide derivatives  Home Medications   Prior to Admission medications   Medication Sig Start Date End Date Taking? Authorizing Provider  Multiple Vitamin (MULTIVITAMIN) tablet Take 1 tablet by mouth daily.   Yes Historical Provider, MD  Vitamin D, Ergocalciferol, (DRISDOL) 50000 UNITS CAPS capsule  10/27/13  Yes Historical Provider, MD  albuterol (PROVENTIL HFA;VENTOLIN HFA) 108 (90 BASE) MCG/ACT inhaler Inhale 2 puffs into the lungs every 6 (six)  hours as needed for wheezing or shortness of breath. 12/11/13   Lanice Shirts, MD  azelastine (OPTIVAR) 0.05 % ophthalmic solution Place 1 drop into both eyes 2 (two) times daily. 04/25/14   Lanice Shirts, MD  azithromycin (ZITHROMAX) 250 MG tablet Take as directed 04/25/14   Lanice Shirts, MD  ciprofloxacin (CIPRO) 500 MG tablet Take 1 tablet (500 mg total) by mouth 2 (two) times daily. 06/04/14   Janeann Forehand, MD  naproxen (NAPROSYN) 500 MG tablet Take 1 tablet (500 mg total) by mouth 2 (two) times daily with a meal. 07/25/13   Johnna Acosta, MD  traMADol (ULTRAM) 50 MG tablet Take 1 tablet (50 mg total) by mouth every 6 (six) hours as needed for pain. 07/25/13   Johnna Acosta, MD   BP 114/79  Pulse 60  Temp(Src) 98.1 F (36.7 C) (Oral)  Ht 5' 7.25" (1.708 m)  Wt 230 lb (104.327 kg)  BMI 35.76 kg/m2  SpO2 99% Physical Exam  Nursing note and vitals reviewed. Constitutional: She is oriented to person, place, and time. She appears well-developed and well-nourished.  HENT:  Head: Normocephalic and atraumatic.  Eyes: No scleral icterus.  Neck: Neck supple.  Cardiovascular: Regular rhythm and normal heart sounds.   Pulmonary/Chest: Effort normal and breath sounds normal. No respiratory distress.  Abdominal: Soft. Normal appearance and bowel sounds are normal. She exhibits no mass. There is no rebound, no guarding and no CVA tenderness.  Neurological: She is alert and oriented to person, place, and time.  Skin: Skin is warm and dry.  Psychiatric: She has a normal mood and affect. Her speech is normal.    ED Course  Procedures (including critical care time) Labs Review Labs Reviewed  URINE CULTURE  POCT URINALYSIS DIP (MANUAL ENTRY)    Imaging Review No results found.    MDM   1. Dysuria    1) Take the prescribed antibiotic as directed.  Cipro since allergic to sulfa was called in to her pharmacy. 2) A urinalysis was done in clinic which shows small  leukocytes, positive nitrites with trace protein, trace blood.  A urine culture is pending. 3) Follow up with your PCP or urologist if not improving or if worsening symptoms.   Janeann Forehand, MD 06/04/14 1415

## 2014-06-06 LAB — URINE CULTURE

## 2014-06-07 ENCOUNTER — Telehealth: Payer: Self-pay | Admitting: Emergency Medicine

## 2014-08-20 NOTE — Telephone Encounter (Signed)
error 

## 2014-09-18 ENCOUNTER — Encounter: Payer: Self-pay | Admitting: Family Medicine

## 2014-09-18 ENCOUNTER — Ambulatory Visit (INDEPENDENT_AMBULATORY_CARE_PROVIDER_SITE_OTHER): Payer: 59 | Admitting: Family Medicine

## 2014-09-18 ENCOUNTER — Ambulatory Visit (INDEPENDENT_AMBULATORY_CARE_PROVIDER_SITE_OTHER): Payer: 59 | Admitting: Internal Medicine

## 2014-09-18 ENCOUNTER — Encounter: Payer: Self-pay | Admitting: Internal Medicine

## 2014-09-18 VITALS — BP 108/64 | HR 56 | Resp 16 | Ht 67.25 in | Wt 231.0 lb

## 2014-09-18 VITALS — BP 123/82 | HR 61 | Ht 68.0 in | Wt 231.4 lb

## 2014-09-18 DIAGNOSIS — M25562 Pain in left knee: Secondary | ICD-10-CM

## 2014-09-18 DIAGNOSIS — M25561 Pain in right knee: Secondary | ICD-10-CM

## 2014-09-18 MED ORDER — METHYLPREDNISOLONE ACETATE 40 MG/ML IJ SUSP
40.0000 mg | Freq: Once | INTRAMUSCULAR | Status: AC
  Administered 2014-09-18: 40 mg via INTRA_ARTICULAR

## 2014-09-18 NOTE — Progress Notes (Signed)
Patient ID: Jennifer Harrell, female   DOB: [birth year], 49 y.o.   MRN: [id number]  PCP and referred by: Kelton Pillar, MD  Subjective:   HPI: Patient is a 48 y.o. female here for bilateral knee pain.  Patient reports she previously had problems with her knees about 13 years ago before she had weight loss surgery (was over 370 pounds at that time). Recalls getting an injection into one of her knees which helped. Has had some off and on pain in knees since then but nothing consistent until past week when she started working out with a trainer again. Right knee worse than the left knee. Taking tylenol, biofreeze, and cumerin. Worse with stairs. Some swelling of right knee. Can't take nsaids due to prior weight loss surgery. No catching, locking.  Past Medical History  Diagnosis Date  . Vitamin D deficiency   . Anemia   . History of breast surgery 1983    reduction  . Fibromyalgia     Current Outpatient Prescriptions on File Prior to Visit  Medication Sig Dispense Refill  . albuterol (PROVENTIL HFA;VENTOLIN HFA) 108 (90 BASE) MCG/ACT inhaler Inhale 2 puffs into the lungs every 6 (six) hours as needed for wheezing or shortness of breath.  1 Inhaler  0  . Multiple Vitamin (MULTIVITAMIN) tablet Take 1 tablet by mouth daily.      . Vitamin D, Ergocalciferol, (DRISDOL) 50000 UNITS CAPS capsule        No current facility-administered medications on file prior to visit.    Past Surgical History  Procedure Laterality Date  . Breast reduction    . Bpdd weight loss surgery  2002  . Uterine ablation    . Abdominoplasty  2005  . Thigh lift      Allergies  Allergen Reactions  . Prednisone Swelling  . Sulfonamide Derivatives     History   Social History  . Marital Status: Married    Spouse Name: N/A    Number of Children: N/A  . Years of Education: N/A   Occupational History  . Secondary school teacher    Social History Main Topics  . Smoking status: Never Smoker   .  Smokeless tobacco: Never Used  . Alcohol Use: No  . Drug Use: No  . Sexual Activity: Yes    Partners: Male   Other Topics Concern  . Not on file   Social History Narrative  . No narrative on file    Family History  Problem Relation Age of Onset  . Cancer Maternal Grandfather     stomach  . Diabetes Mother   . Diabetes Maternal Grandfather   . Heart failure Maternal Grandfather   . Heart failure Maternal Grandmother     BP 123/82  Pulse 61  Ht 5\' 8"  (1.727 m)  Wt 231 lb 6.4 oz (104.962 kg)  BMI 35.19 kg/m2  Review of Systems: See HPI above.    Objective:  Physical Exam:  Gen: NAD  Bilateral knees: No gross deformity, ecchymoses, effusion. TTP lateral joint lines > medial joint lines.  Some tenderness left post patellar facets. FROM. Negative ant/post drawers. Negative valgus/varus testing. Negative lachmanns. Negative mcmurrays, apleys, patellar apprehension. NV intact distally.    Assessment & Plan:  1. Bilateral knee pain - consistent with a flare of arthritis.  Continue tylenol, biofreeze, cumerin.  Given bilateral intraarticular cortisone injections today.  Consider Glucosamine.  HEP reviewed - consider physical therapy in future.  Heat or ice 15 minutes at a time 3-4 times  a day as needed to help with pain.  Follow up with me in 1 month or as needed.  After informed written consent, patient was seated on exam table. Right knee was prepped with alcohol swab and utilizing anteromedial approach, patient's right knee was injected intraarticularly with 3:1 marcaine: depomedrol. Patient tolerated the procedure well without immediate complications.  After informed written consent, patient was seated on exam table. Left knee was prepped with alcohol swab and utilizing anteromedial approach, patient's left knee was injected intraarticularly with 3:1 marcaine: depomedrol. Patient tolerated the procedure well without immediate complications.

## 2014-09-18 NOTE — Patient Instructions (Signed)
Your knee pain is most consistent with a flare of arthritis. Take tylenol 500mg  1-2 tabs three times a day for pain. Biofreeze and cumerin as you have been. Consider Glucosamine sulfate 750mg  twice a day - this is a supplement that may help. Cortisone injections are an option - you were given these today. It's important that you continue to stay active. Straight leg raises, knee extensions, hamstring curls 3 sets of 10 once a day (add ankle weight if these become too easy) (speak to your trainer about these). Consider physical therapy to strengthen muscles around the joint that hurts to take pressure off of the joint itself. Shoe inserts with good arch support may be helpful. Heat or ice 15 minutes at a time 3-4 times a day as needed to help with pain. Water aerobics and cycling with low resistance are the best two types of exercise for arthritis. Follow up with me in 1 month or as needed.

## 2014-09-18 NOTE — Progress Notes (Signed)
Subjective:    Patient ID: Jennifer Harrell, female    DOB: Sep 12, 1966, 48 y.o.   MRN: [id number]  HPI  Acute visit    Began exercise work out with a trainer and R knee painful now.  No popping sound,  No locking ,  Knee does not buckle.  Steroid injection has helped int he past  No edema to day but will swell at times  Intolerant of NSAIDS but will take Tylenol at night  .  Allergies  Allergen Reactions  . Prednisone Swelling  . Sulfonamide Derivatives    Past Medical History  Diagnosis Date  . Vitamin D deficiency   . Anemia   . History of breast surgery 1983    reduction  . Fibromyalgia    Past Surgical History  Procedure Laterality Date  . Breast reduction    . Bpdd weight loss surgery  2002  . Uterine ablation    . Abdominoplasty  2005  . Thigh lift     History   Social History  . Marital Status: Married    Spouse Name: N/A    Number of Children: N/A  . Years of Education: N/A   Occupational History  . Secondary school teacher    Social History Main Topics  . Smoking status: Never Smoker   . Smokeless tobacco: Never Used  . Alcohol Use: No  . Drug Use: No  . Sexual Activity: Yes    Partners: Male   Other Topics Concern  . Not on file   Social History Narrative  . No narrative on file   Family History  Problem Relation Age of Onset  . Cancer Maternal Grandfather     stomach  . Diabetes Mother   . Diabetes Maternal Grandfather   . Heart failure Maternal Grandfather   . Heart failure Maternal Grandmother    Patient Active Problem List   Diagnosis Date Noted  . Internal bleeding hemorrhoids 06/20/2013  . Guaiac positive stools 02/26/2013  . Zinc deficiency 10/24/2012  . Vitamin a deficiency 08/25/2012  . Sinus bradycardia 08/15/2012  . Allergic rhinitis due to allergen 08/15/2012  . Vitamin d deficiency 05/12/2012  . History of anemia 05/10/2012  . History of fibromyalgia 05/10/2012  . Lymphedema 05/10/2012  . Malabsorption syndrome  05/10/2012  . Seborrheic dermatitis of scalp 05/10/2012  . 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 S/P endometrial ablation 05/10/2012  . History of abdominoplasty 05/10/2012  . DEFICIENCY OF OTHER VITAMINS 02/05/2010  . PREMATURE VENTRICULAR CONTRACTIONS 02/05/2010  . BRADYCARDIA 02/05/2010   Current Outpatient Prescriptions on File Prior to Visit  Medication Sig Dispense Refill  . albuterol (PROVENTIL HFA;VENTOLIN HFA) 108 (90 BASE) MCG/ACT inhaler Inhale 2 puffs into the lungs every 6 (six) hours as needed for wheezing or shortness of breath.  1 Inhaler  0  . Multiple Vitamin (MULTIVITAMIN) tablet Take 1 tablet by mouth daily.      . Vitamin D, Ergocalciferol, (DRISDOL) 50000 UNITS CAPS capsule        No current facility-administered medications on file prior to visit.        Review of Systems See HPI    Objective:   Physical Exam  Physical Exam  Nursing note and vitals reviewed.  Constitutional: She is oriented to person, place, and time. She appears well-developed and well-nourished.  HENT:  Head: Normocephalic and atraumatic.  Cardiovascular: Normal rate and regular rhythm. Exam reveals no gallop and no friction rub.  No murmur heard.  Pulmonary/Chest: Breath sounds  normal. She has no wheezes. She has no rales.  Neurological: She is alert and oriented to person, place, and time.  Skin: Skin is warm and dry.  M/S  Right knee no edema .  Pain with lateral ROM.  Ant drawer Lachmannn's neg  Psychiatric: She has a normal mood and affect. Her behavior is normal.         Assessment & Plan:  R knee pain post exercise  :    Will set up referral to Dr. Barbaraann Barthel  Pt declines imaging today

## 2014-09-18 NOTE — Assessment & Plan Note (Signed)
consistent with a flare of arthritis.  Continue tylenol, biofreeze, cumerin.  Given bilateral intraarticular cortisone injections today.  Consider Glucosamine.  HEP reviewed - consider physical therapy in future.  Heat or ice 15 minutes at a time 3-4 times a day as needed to help with pain.  Follow up with me in 1 month or as needed.  After informed written consent, patient was seated on exam table. Right knee was prepped with alcohol swab and utilizing anteromedial approach, patient's right knee was injected intraarticularly with 3:1 marcaine: depomedrol. Patient tolerated the procedure well without immediate complications.  After informed written consent, patient was seated on exam table. Left knee was prepped with alcohol swab and utilizing anteromedial approach, patient's left knee was injected intraarticularly with 3:1 marcaine: depomedrol. Patient tolerated the procedure well without immediate complications.

## 2014-10-01 ENCOUNTER — Encounter: Payer: Self-pay | Admitting: Family Medicine

## 2015-01-18 ENCOUNTER — Encounter: Payer: Self-pay | Admitting: Family Medicine

## 2015-01-18 ENCOUNTER — Ambulatory Visit (HOSPITAL_BASED_OUTPATIENT_CLINIC_OR_DEPARTMENT_OTHER)
Admission: RE | Admit: 2015-01-18 | Discharge: 2015-01-18 | Disposition: A | Discharge status: Home / Self Care | Payer: 59 | Source: Ambulatory Visit | Attending: Family Medicine | Admitting: Family Medicine

## 2015-01-18 ENCOUNTER — Ambulatory Visit (INDEPENDENT_AMBULATORY_CARE_PROVIDER_SITE_OTHER): Payer: 59 | Admitting: Family Medicine

## 2015-01-18 VITALS — BP 106/70 | HR 58 | Ht 68.0 in | Wt 206.4 lb

## 2015-01-18 DIAGNOSIS — M25562 Pain in left knee: Secondary | ICD-10-CM | POA: Diagnosis not present

## 2015-01-18 DIAGNOSIS — M25561 Pain in right knee: Secondary | ICD-10-CM

## 2015-01-18 DIAGNOSIS — M25551 Pain in right hip: Secondary | ICD-10-CM

## 2015-01-18 MED ORDER — METHYLPREDNISOLONE ACETATE 40 MG/ML IJ SUSP
40.0000 mg | Freq: Once | INTRAMUSCULAR | Status: AC
  Administered 2015-01-18: 40 mg via INTRA_ARTICULAR

## 2015-01-18 NOTE — Progress Notes (Signed)
Patient ID: Jennifer Harrell, female   DOB: 08/01/1966, 49 y.o.   MRN: [id number]  PCP and referred by: Kelton Pillar, MD  Subjective:   HPI: Patient is a 49 y.o. female here for bilateral knee pain.  09/18/14: Patient reports she previously had problems with her knees about 13 years ago before she had weight loss surgery (was over 370 pounds at that time). Recalls getting an injection into one of her knees which helped. Has had some off and on pain in knees since then but nothing consistent until past week when she started working out with a trainer again. Right knee worse than the left knee. Taking tylenol, biofreeze, and cumerin. Worse with stairs. Some swelling of right knee. Can't take nsaids due to prior weight loss surgery. No catching, locking.  01/18/15: Patient reports her knees have started to bother her past couple weeks again, right greater than left. Doing a lot of exercising. No swelling of knees. No catching, locking, giving out. Taking aleve and using heat for these and her right hip. Right hip started hurting out of the blue 2 days ago when standing. Worse sitting on a hard surface. Radiates into knee but no numbness/tingling.  Past Medical History  Diagnosis Date  . Vitamin D deficiency   . Anemia   . History of breast surgery 1983    reduction  . Fibromyalgia     Current Outpatient Prescriptions on File Prior to Visit  Medication Sig Dispense Refill  . albuterol (PROVENTIL HFA;VENTOLIN HFA) 108 (90 BASE) MCG/ACT inhaler Inhale 2 puffs into the lungs every 6 (six) hours as needed for wheezing or shortness of breath. 1 Inhaler 0  . Multiple Vitamin (MULTIVITAMIN) tablet Take 1 tablet by mouth daily.    . Vitamin D, Ergocalciferol, (DRISDOL) 50000 UNITS CAPS capsule      No current facility-administered medications on file prior to visit.    Past Surgical History  Procedure Laterality Date  . Breast reduction    . Bpdd weight loss surgery  2002   . Uterine ablation    . Abdominoplasty  2005  . Thigh lift      Allergies  Allergen Reactions  . Prednisone Swelling  . Sulfonamide Derivatives     History   Social History  . Marital Status: Married    Spouse Name: N/A  . Number of Children: N/A  . Years of Education: N/A   Occupational History  . Secondary school teacher    Social History Main Topics  . Smoking status: Never Smoker   . Smokeless tobacco: Never Used  . Alcohol Use: No  . Drug Use: No  . Sexual Activity:    Partners: Male   Other Topics Concern  . Not on file   Social History Narrative    Family History  Problem Relation Age of Onset  . Cancer Maternal Grandfather     stomach  . Diabetes Mother   . Diabetes Maternal Grandfather   . Heart failure Maternal Grandfather   . Heart failure Maternal Grandmother     BP 106/70 mmHg  Pulse 58  Ht 5\' 8"  (1.727 m)  Wt 206 lb 6.4 oz (93.622 kg)  BMI 31.39 kg/m2  Review of Systems: See HPI above.    Objective:  Physical Exam:  Gen: NAD  Bilateral knees: No gross deformity, ecchymoses, effusion. TTP medial joint lines > post patellar facets.  No lateral tenderness.   FROM. Negative ant/post drawers. Negative valgus/varus testing. Negative lachmanns. Negative mcmurrays, apleys, patellar apprehension.  NV intact distally.  R hip: No gross deformity, scoliosis. TTP piriformis and proximal IT band.  No midline or bony TTP. FROM. Negative SLRs. Negative logroll bilateral hips Positive piriformis and fabers on right.    Assessment & Plan:  1. Bilateral knee pain - consistent with a flare of arthritis.  Radiographs and ultrasound show only mild medial arthritis.  Discussed options - continue tylenol, biofreeze, cumerin.  Repeated bilateral intraarticular cortisone injections today.  Consider Glucosamine.  Continue HEP.  Consider physical therapy in future.  F/u in 5-6 weeks or prn.  After informed written consent, patient was seated on exam table.  Right knee was prepped with alcohol swab and utilizing anteromedial approach, patient's right knee was injected intraarticularly with 3:1 marcaine: depomedrol. Patient tolerated the procedure well without immediate complications.  After informed written consent, patient was seated on exam table. Left knee was prepped with alcohol swab and utilizing anteromedial approach, patient's left knee was injected intraarticularly with 3:1 marcaine: depomedrol. Patient tolerated the procedure well without immediate complications.  2. Right hip pain - 2/2 piriformis syndrome and proximal IT band syndrome.  Shown home exercises and stretches to do daily.  Icing, tylenol and aleve as needed.  Consider physical therapy.  F/u in 5-6 weeks.

## 2015-01-18 NOTE — Assessment & Plan Note (Signed)
consistent with a flare of arthritis.  Radiographs and ultrasound show only mild medial arthritis.  Discussed options - continue tylenol, biofreeze, cumerin.  Repeated bilateral intraarticular cortisone injections today.  Consider Glucosamine.  Continue HEP.  Consider physical therapy in future.  F/u in 5-6 weeks or prn.  After informed written consent, patient was seated on exam table. Right knee was prepped with alcohol swab and utilizing anteromedial approach, patient's right knee was injected intraarticularly with 3:1 marcaine: depomedrol. Patient tolerated the procedure well without immediate complications.  After informed written consent, patient was seated on exam table. Left knee was prepped with alcohol swab and utilizing anteromedial approach, patient's left knee was injected intraarticularly with 3:1 marcaine: depomedrol. Patient tolerated the procedure well without immediate complications.

## 2015-01-18 NOTE — Patient Instructions (Signed)
For your knees: Take tylenol 500mg  1-2 tabs three times a day for pain. Aleve 1-2 tabs twice a day with food Glucosamine sulfate 750mg  twice a day is a supplement that may help. Capsaicin topically up to four times a day may also help with pain. Cortisone injections are an option -- we repeated these today. If cortisone injections do not help, there are different types of shots that may help but they take longer to take effect. It's important that you continue to stay active. Straight leg raises, knee extensions 3 sets of 10 once a day (add ankle weight if these become too easy). Consider physical therapy to strengthen muscles around the joint that hurts to take pressure off of the joint itself. Shoe inserts with good arch support may be helpful. Heat or ice 15 minutes at a time 3-4 times a day as needed to help with pain.  Your hip pain is due to piriformis syndrome with an element of IT band syndrome. Avoid painful activities as much as possible. Ice over area of pain 3-4 times a day for 15 minutes at a time Hip side raises and standing hip rotations 3 x 10 once a day - add weights if this becomes too easy. Stretches - pick 2-3 and hold for 20-30 seconds x 3 - do once or twice a day. Tylenol and/or aleve as needed for pain. If not improving, can consider formal physical therapy. Follow up with me in 5-6 weeks or as needed.

## 2015-01-18 NOTE — Assessment & Plan Note (Signed)
2/2 piriformis syndrome and proximal IT band syndrome.  Shown home exercises and stretches to do daily.  Icing, tylenol and aleve as needed.  Consider physical therapy.  F/u in 5-6 weeks.

## 2015-02-25 ENCOUNTER — Telehealth: Payer: Self-pay | Admitting: Family Medicine

## 2015-02-25 ENCOUNTER — Other Ambulatory Visit: Payer: Self-pay | Admitting: *Deleted

## 2015-02-25 DIAGNOSIS — M25561 Pain in right knee: Secondary | ICD-10-CM

## 2015-02-25 DIAGNOSIS — M25551 Pain in right hip: Secondary | ICD-10-CM

## 2015-02-25 DIAGNOSIS — M25562 Pain in left knee: Principal | ICD-10-CM

## 2015-02-25 NOTE — Telephone Encounter (Signed)
Ok to go ahead with PT.  Thanks!

## 2015-03-13 ENCOUNTER — Ambulatory Visit: Payer: 59 | Attending: Family Medicine | Admitting: Physical Therapy

## 2015-03-13 ENCOUNTER — Encounter: Payer: Self-pay | Admitting: Physical Therapy

## 2015-03-13 DIAGNOSIS — M25562 Pain in left knee: Secondary | ICD-10-CM | POA: Insufficient documentation

## 2015-03-13 DIAGNOSIS — R262 Difficulty in walking, not elsewhere classified: Secondary | ICD-10-CM | POA: Insufficient documentation

## 2015-03-13 DIAGNOSIS — M25561 Pain in right knee: Secondary | ICD-10-CM | POA: Insufficient documentation

## 2015-03-13 DIAGNOSIS — M6289 Other specified disorders of muscle: Secondary | ICD-10-CM | POA: Diagnosis not present

## 2015-03-13 DIAGNOSIS — R29898 Other symptoms and signs involving the musculoskeletal system: Secondary | ICD-10-CM

## 2015-03-13 NOTE — Therapy (Signed)
[facility] [address]  [place], [place], [address] Phone: [phone]   Fax:  [phone]  Physical Therapy Evaluation  Patient Details  Name: Jennifer Harrell MRN: [id number] Date of Birth: 1966-04-18 Referring Provider:  Dene Gentry, MD  Encounter Date: 03/13/2015      PT End of Session - 03/13/15 0807    Visit Number 1   Number of Visits 12   Date for PT Re-Evaluation 04/24/15   PT Start Time 3474   PT Stop Time 0852   PT Time Calculation (min) 57 min      Past Medical History  Diagnosis Date  . Vitamin D deficiency   . Anemia   . History of breast surgery 1983    reduction  . Fibromyalgia     Past Surgical History  Procedure Laterality Date  . Breast reduction    . Bpdd weight loss surgery  2002  . Uterine ablation    . Abdominoplasty  2005  . Thigh lift      There were no vitals filed for this visit.  Visit Diagnosis:  Arthralgia of both knees - Plan: PT plan of care cert/re-cert  Difficulty walking - Plan: PT plan of care cert/re-cert  Weakness of both hips - Plan: PT plan of care cert/re-cert      Subjective Assessment - 03/13/15 0757    Subjective Pt referred to [facility] due to B Knee pain and R Hip pain.  She reports she began exercising to assist with improved health and she began experiencing B knee pain early Oct 2015.  She went to [facility] and received injections B knees on 10Oct15 and noted benefit.  Pain returned and she went back to [facility] to receive 2nd bout of knee injections on [id number].  She reports noting onset of R hip pain in Feb as well but she states HEP from Dr. Barbaraann Barthel seems to have helped with this and no longer seems to be a problem.  She states R knee pain is worse than L.   Patient Stated Goals reduced pain with exercise   Currently in Pain? Yes   Pain Score --  rates AVG pain 3/10 and worst 6.5/10   Pain Location Knee   Pain Orientation Left;Right;Anterior   Aggravating Factors  squats, lunges, prolonged standing or walking   Pain Relieving Factors heat, meds   Multiple Pain Sites No            [facility] PT Assessment - 03/13/15 0001    Assessment   Medical Diagnosis B Knee and R Hip Pain   Onset Date 08/30/14   Balance Screen   Has the patient fallen in the past 6 months No   Has the patient had a decrease in activity level because of a fear of falling?  No   Is the patient reluctant to leave their home because of a fear of falling?  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   Leisure exercises 5 days/wk to include cardio and resistance training   Observation/Other Assessments   Focus on Therapeutic Outcomes (FOTO)  42% limitation   Functional Tests   Functional tests Squat;Single Leg Squat;Single leg stance   Squat   Comments good wt through heels but excessive trunk flexion at bottom of movement   Single Leg Squat   Comments limited tolerance due to B  knee pain, poor control with some femur IR   Single Leg Stance   Comments unsteady B, limited to 10" or less   ROM / Strength   AROM / PROM / Strength Strength   Strength   Strength Assessment Site Knee;Hip   Right/Left Hip Right;Left   Right Hip Flexion 4+/5   Right Hip Extension 4/5  lateral thigh pain   Right Hip External Rotation  --  4/5   Right Hip Internal Rotation  --  5/5   Right Hip ABduction 4/5  meidal knee pain   Left Hip Flexion 4/5   Left Hip Extension 4+/5   Left Hip External Rotation  --  4/5   Left Hip Internal Rotation  --  5/5   Left Hip ABduction 4/5  lateral hip pain   Right/Left Knee Right;Left   Right Knee Flexion 4+/5  pain   Right Knee Extension 4-/5  pain   Left Knee Flexion 4/5  pain   Left Knee Extension 3+/5  pain   Palpation   Palpation TTP R knee medial joint line, L proximal patellar tendon, and distal B ITB   Special Tests    Special Tests Knee  Special Tests;Meniscus Tests   Knee Special tests  Patellofemoral Grind Test (Clarke's Sign);other   Meniscus Tests McMurray Test;other;other2   Patellofemoral Grind test (Clark's Sign)   Findings Postive   Side  Right;Left   other    Findings Positive   Side  Right;Left   Comments Noble's Compression   McMurray Test   Findings Positive   Side Right   Comments medial pain without clicking   other   Findings Negative   Side Right;Left   Comments Anterior and Posterior Drawer   other   Findings Negative   Side  Right;Left   Comments Valgus and Varus strain                           PT Education - 03/13/15 0851    Education provided Yes   Education Details HEP, mechanics of pain   Person(s) Educated Patient   Methods Explanation;Demonstration;Handout   Comprehension Verbalized understanding;Returned demonstration          PT Short Term Goals - 03/13/15 1157    PT SHORT TERM GOAL #1   Title pt independent with initial HEP by 03/22/15   Status New           PT Long Term Goals - 03/13/15 1157    PT LONG TERM GOAL #1   Title B Hip MMT 4+/5 or better grossly to allow good hip stability and proper knee mechanics with LE exercises by 04/24/15   Status New   PT LONG TERM GOAL #2   Title pt states she is able to participate in exercise without noting knee pain greater than 2/10 by 04/24/15   Status New   PT LONG TERM GOAL #3   Title pt able to stand/walk for duration/distance not limited by knee pain by 04/24/15   Status New               Plan - 03/13/15 1146    Clinical Impression Statement Pt with c/o B knee pain which began shortly after she began exercising regularly.  Pt had been nearly 400 pounds in the past but underwent gastric bypass several years ago and lost a great deal of weight.  Since beginning regular exercise in she has lost an additional 30ish  pounds.  Her knee pain seems overuse in nature at this time related to sudden onset of  increased exercise without first developing proper hip and LE stability and mechanics.  Current impairments include weak Hip ER, ABD, and Ext along with knee pain with knee MMT.  Knee ROM is Full without pain.  B patella display lateral tracking with active TKE and POS patellar grind indicate PFPS as likely portion of her symptoms.  In addition, pt with TTP to R knee medial joint line and POS McMurrays indicating OA vs possible meniscus issue.  She also displays TTP to B distal ITB along with POS Nobles compression indicating ITB friction syndrome.  Also with TTP L knee anterior joint line vs proximal patellar tendon.   Pt will benefit from skilled therapeutic intervention in order to improve on the following deficits Pain;Improper body mechanics;Difficulty walking;Decreased strength;Obesity;Decreased balance   Rehab Potential Good   PT Frequency 2x / week   PT Duration 6 weeks   PT Treatment/Interventions Therapeutic exercise;Manual techniques;Dry needling;Balance training;Gait training;Therapeutic activities;ADLs/Self Care Home Management;Patient/family education;Electrical Stimulation;Cryotherapy;Moist Heat;Ultrasound   PT Next Visit Plan ITB strumming, patellar medial glides, HS stretch, hip stability training   Consulted and Agree with Plan of Care Patient         Problem List Patient Active Problem List   Diagnosis Date Noted  . Right hip pain 01/18/2015  . Bilateral knee pain 09/18/2014  . Internal bleeding hemorrhoids 06/20/2013  . Guaiac positive stools 02/26/2013  . Zinc deficiency 10/24/2012  . Vitamin A deficiency 08/25/2012  . Sinus bradycardia 08/15/2012  . Allergic rhinitis due to allergen 08/15/2012  . Vitamin D deficiency 05/12/2012  . History of anemia 05/10/2012  . History of fibromyalgia 05/10/2012  . Lymphedema 05/10/2012  . Malabsorption syndrome 05/10/2012  . Seborrheic dermatitis of scalp 05/10/2012  . 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 S/P endometrial  ablation 05/10/2012  . History of abdominoplasty 05/10/2012  . DEFICIENCY OF OTHER VITAMINS 02/05/2010  . PREMATURE VENTRICULAR CONTRACTIONS 02/05/2010  . BRADYCARDIA 02/05/2010    Grady Mohabir PT, OCS 03/13/2015, 12:05 PM  [facility] [address]  [place], [place], [address] Phone: [phone]   Fax:  [phone]

## 2015-03-19 ENCOUNTER — Ambulatory Visit: Payer: 59 | Admitting: Rehabilitation

## 2015-03-19 DIAGNOSIS — R29898 Other symptoms and signs involving the musculoskeletal system: Secondary | ICD-10-CM

## 2015-03-19 DIAGNOSIS — M25561 Pain in right knee: Secondary | ICD-10-CM

## 2015-03-19 DIAGNOSIS — M25562 Pain in left knee: Principal | ICD-10-CM

## 2015-03-19 DIAGNOSIS — R262 Difficulty in walking, not elsewhere classified: Secondary | ICD-10-CM

## 2015-03-19 NOTE — Therapy (Signed)
[facility] [address]  [place], [place], [address] Phone: [phone]   Fax:  [phone]  Physical Therapy Treatment  Patient Details  Name: Jennifer Harrell MRN: [id number] Date of Birth: Jul 18, 1966 Referring Provider:  Dene Gentry, MD  Encounter Date: 03/19/2015      PT End of Session - 03/19/15 0803    Visit Number 2   Number of Visits 12   Date for PT Re-Evaluation 04/24/15   PT Start Time 0804   PT Stop Time 0845   PT Time Calculation (min) 41 min      Past Medical History  Diagnosis Date  . Vitamin D deficiency   . Anemia   . History of breast surgery 1983    reduction  . Fibromyalgia     Past Surgical History  Procedure Laterality Date  . Breast reduction    . Bpdd weight loss surgery  2002  . Uterine ablation    . Abdominoplasty  2005  . Thigh lift      There were no vitals filed for this visit.  Visit Diagnosis:  Arthralgia of both knees  Difficulty walking  Weakness of both hips      Subjective Assessment - 03/19/15 0805    Subjective Reports working out this morning so her knees feel pretty good. States she performed her HEP over the weekend and realizes that she is weak  in some areas.    Currently in Pain? No/denies       Today's Treatment: ThereEx- Rec Bike level 1.0 x4' Bridges 10x3" Bridges with blue TB 12x3" Side-lying clams with blue TB 12x TRX DL squats 15x Stretch bilateral hamstring and piriformis  Manual- Side-lying STM/Strumming to bilateral ITB Side-lying patellar medial glides to bilateral knees           PT Short Term Goals - 03/19/15 0845    PT SHORT TERM GOAL #1   Title pt independent with initial HEP by 03/22/15   Status Achieved           PT Long Term Goals - 03/19/15 0846    PT LONG TERM GOAL #1   Title B Hip MMT 4+/5 or better grossly to allow good hip stability and proper knee mechanics with LE exercises by 04/24/15   Status On-going   PT LONG TERM GOAL #2   Title pt states she is able to participate in exercise without noting knee pain greater than 2/10 by 04/24/15   Status On-going   PT LONG TERM GOAL #3   Title pt able to stand/walk for duration/distance not limited by knee pain by 04/24/15   Status On-going               Plan - 03/19/15 0846    Clinical Impression Statement Good tolerance with exercises today, very tender bilateral ITB during manual work.    PT Next Visit Plan ITB strumming, patellar medial glides, HS stretch, hip stability training   Consulted and Agree with Plan of Care Patient        Problem List Patient Active Problem List   Diagnosis Date Noted  . Right hip pain 01/18/2015  . Bilateral knee pain 09/18/2014  . Internal bleeding hemorrhoids 06/20/2013  . Guaiac positive stools 02/26/2013  . Zinc deficiency 10/24/2012  . Vitamin A deficiency 08/25/2012  . Sinus bradycardia 08/15/2012  . Allergic rhinitis due to allergen 08/15/2012  . Vitamin D deficiency 05/12/2012  . History of anemia  05/10/2012  . History of fibromyalgia 05/10/2012  . Lymphedema 05/10/2012  . Malabsorption syndrome 05/10/2012  . Seborrheic dermatitis of scalp 05/10/2012  . 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 S/P endometrial ablation 05/10/2012  . History of abdominoplasty 05/10/2012  . DEFICIENCY OF OTHER VITAMINS 02/05/2010  . PREMATURE VENTRICULAR CONTRACTIONS 02/05/2010  . BRADYCARDIA 02/05/2010    Barbette Hair, PTA 03/19/2015, 8:48 AM  [facility] [place], [place], [address] Phone: [phone]   Fax:  [phone]

## 2015-03-22 ENCOUNTER — Encounter: Payer: 59 | Admitting: Physical Therapy

## 2015-03-26 ENCOUNTER — Ambulatory Visit: Payer: 59 | Admitting: Physical Therapy

## 2015-03-26 DIAGNOSIS — M25561 Pain in right knee: Secondary | ICD-10-CM

## 2015-03-26 DIAGNOSIS — M25562 Pain in left knee: Principal | ICD-10-CM

## 2015-03-26 DIAGNOSIS — R262 Difficulty in walking, not elsewhere classified: Secondary | ICD-10-CM

## 2015-03-26 DIAGNOSIS — R29898 Other symptoms and signs involving the musculoskeletal system: Secondary | ICD-10-CM

## 2015-03-26 NOTE — Therapy (Signed)
[facility] [address]  [place], [place], [address] Phone: [phone]   Fax:  [phone]  Physical Therapy Treatment  Patient Details  Name: Jennifer Harrell MRN: [id number] Date of Birth: 12-26-1965 Referring Provider:  Dene Gentry, MD  Encounter Date: 03/26/2015      PT End of Session - 03/26/15 0906    Visit Number 3   Number of Visits 12   Date for PT Re-Evaluation 04/24/15   PT Start Time 0853   PT Stop Time 0933   PT Time Calculation (min) 40 min      Past Medical History  Diagnosis Date  . Vitamin D deficiency   . Anemia   . History of breast surgery 1983    reduction  . Fibromyalgia     Past Surgical History  Procedure Laterality Date  . Breast reduction    . Bpdd weight loss surgery  2002  . Uterine ablation    . Abdominoplasty  2005  . Thigh lift      There were no vitals filed for this visit.  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  Notes mild pain with lunges and "spiderman climbs" which are similar to mountain climbers.   Currently in Pain? Yes   Pain Score 4    Pain Location Knee   Pain Orientation Right;Anterior   Multiple Pain Sites Yes   Pain Score 3   Pain Location Knee   Pain Orientation Left;Anterior      TODAY'S TREATMENT: TherEx - Bridge 15x Bridge with March 10x Stretch B HS and ITB/Lateral Glutes Side Bridge 10x each Side-Lying Clam Black TB 15x each Attempted ALT SLR with Red TB but unable due to increased L Knee pain Fitter BW Lunge 1 Black + 1 Blue with 2 Pole A 15x each (difficult form) Fitter Lateral Lunge 1 Black + 1 Blue with 2 Pole A 15x each                             PT Short Term Goals  - 03/19/15 0845    PT SHORT TERM GOAL #1   Title pt independent with initial HEP by 03/22/15   Status Achieved           PT Long Term Goals - 03/19/15 0846    PT LONG TERM GOAL #1   Title B Hip MMT 4+/5 or better grossly to allow good hip stability and proper knee mechanics with LE exercises by 04/24/15   Status On-going   PT LONG TERM GOAL #2   Title pt states she is able to participate in exercise without noting knee pain greater than 2/10 by 04/24/15   Status On-going   PT LONG TERM GOAL #3   Title pt able to stand/walk for duration/distance not limited by knee pain by 04/24/15   Status On-going               Plan - 03/26/15 0918    Clinical Impression Statement Tolerates exercises well but significant weakness in B hips noted with bridging movements (very difficult maintaining level pelvis with marching bridge, hardly able to perform side bridges).  Problem List Patient Active Problem List   Diagnosis Date Noted  . Right hip pain 01/18/2015  . Bilateral knee pain 09/18/2014  . Internal bleeding hemorrhoids 06/20/2013  . Guaiac positive stools 02/26/2013  . Zinc deficiency 10/24/2012  . Vitamin A deficiency 08/25/2012  . Sinus bradycardia 08/15/2012  . Allergic rhinitis due to allergen 08/15/2012  . Vitamin D deficiency 05/12/2012  . History of anemia 05/10/2012  . History of fibromyalgia 05/10/2012  . Lymphedema 05/10/2012  . Malabsorption syndrome 05/10/2012  . Seborrheic dermatitis of scalp 05/10/2012  . 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 S/P endometrial ablation 05/10/2012  . History of abdominoplasty 05/10/2012  . DEFICIENCY OF OTHER VITAMINS 02/05/2010  . PREMATURE VENTRICULAR CONTRACTIONS 02/05/2010  . BRADYCARDIA 02/05/2010    Giovany Cosby PT, OCS 03/27/2015, 8:06 AM  [facility] [place], [place], [address] Phone: [phone]   Fax:  [phone]

## 2015-03-28 ENCOUNTER — Ambulatory Visit: Payer: 59 | Admitting: Rehabilitation

## 2015-03-28 DIAGNOSIS — R29898 Other symptoms and signs involving the musculoskeletal system: Secondary | ICD-10-CM

## 2015-03-28 DIAGNOSIS — R262 Difficulty in walking, not elsewhere classified: Secondary | ICD-10-CM

## 2015-03-28 DIAGNOSIS — M25561 Pain in right knee: Secondary | ICD-10-CM | POA: Diagnosis not present

## 2015-03-28 DIAGNOSIS — M25562 Pain in left knee: Principal | ICD-10-CM

## 2015-03-28 NOTE — Therapy (Signed)
[facility] [address]  [place], [place], [address] Phone: [phone]   Fax:  [phone]  Physical Therapy Treatment  Patient Details  Name: Jennifer Harrell MRN: [id number] Date of Birth: 06-Mar-1966 Referring Provider:  Dene Gentry, MD  Encounter Date: 03/28/2015      PT End of Session - 03/28/15 0844    Visit Number 4   Number of Visits 12   Date for PT Re-Evaluation 04/24/15   PT Start Time 0845   PT Stop Time 0923   PT Time Calculation (min) 38 min      Past Medical History  Diagnosis Date  . Vitamin D deficiency   . Anemia   . History of breast surgery 1983    reduction  . Fibromyalgia     Past Surgical History  Procedure Laterality Date  . Breast reduction    . Bpdd weight loss surgery  2002  . Uterine ablation    . Abdominoplasty  2005  . Thigh lift      There were no vitals filed for this visit.  Visit Diagnosis:  Arthralgia of both knees  Weakness of both hips  Difficulty walking      Subjective Assessment - 03/28/15 0846    Subjective Very sore after last time. Worked out this morning with a mixture of LE and UE including squats/lunges/running.     Currently in Pain? Yes   Pain Score 2    Pain Location Knee   Pain Orientation Right;Anterior;Left        TODAY'S TREATMENT: TherEx - Bridge with Black TB around knees 15x Side-Lying Clam Black TB 15x each Side Bridge 10x each Stretch B HS and ITB/Lateral Glutes SL bridges (minimal motion and slight pain noted on Rt with Lt SL bridge) Side-stepping with Green TB at ankles 25'x2 each way Fitter BW Lunge 1 Black + 1 Blue with 2 Pole A 15x each (improved form) Fitter Lateral Lunge 1 Black + 1 Blue with 2 Pole A 15x each (improved form) Fitter side-side 1 Black + 1 Blue with 2 pole A 2'        PT Short Term Goals - 03/19/15 0845    PT SHORT TERM GOAL #1   Title pt independent with initial HEP by 03/22/15   Status  Achieved           PT Long Term Goals - 03/19/15 0846    PT LONG TERM GOAL #1   Title B Hip MMT 4+/5 or better grossly to allow good hip stability and proper knee mechanics with LE exercises by 04/24/15   Status On-going   PT LONG TERM GOAL #2   Title pt states she is able to participate in exercise without noting knee pain greater than 2/10 by 04/24/15   Status On-going   PT LONG TERM GOAL #3   Title pt able to stand/walk for duration/distance not limited by knee pain by 04/24/15   Status On-going               Plan - 03/28/15 0921    Clinical Impression Statement Still weakness with bridging exercises but improved form on Fitter today.    PT Next Visit Plan ITB strumming, patellar medial glides, HS stretch, hip stability training   Consulted and Agree with Plan of Care Patient        Problem List Patient Active Problem List   Diagnosis Date Noted  . Right hip pain  01/18/2015  . Bilateral knee pain 09/18/2014  . Internal bleeding hemorrhoids 06/20/2013  . Guaiac positive stools 02/26/2013  . Zinc deficiency 10/24/2012  . Vitamin A deficiency 08/25/2012  . Sinus bradycardia 08/15/2012  . Allergic rhinitis due to allergen 08/15/2012  . Vitamin D deficiency 05/12/2012  . History of anemia 05/10/2012  . History of fibromyalgia 05/10/2012  . Lymphedema 05/10/2012  . Malabsorption syndrome 05/10/2012  . Seborrheic dermatitis of scalp 05/10/2012  . 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 S/P endometrial ablation 05/10/2012  . History of abdominoplasty 05/10/2012  . DEFICIENCY OF OTHER VITAMINS 02/05/2010  . PREMATURE VENTRICULAR CONTRACTIONS 02/05/2010  . BRADYCARDIA 02/05/2010    Barbette Hair, PTA 03/28/2015, 9:23 AM  [facility] [place], [place], [address] Phone: [phone]   Fax:  [phone]

## 2015-04-01 ENCOUNTER — Ambulatory Visit: Payer: 59 | Attending: Family Medicine | Admitting: Physical Therapy

## 2015-04-01 DIAGNOSIS — M25562 Pain in left knee: Secondary | ICD-10-CM | POA: Diagnosis not present

## 2015-04-01 DIAGNOSIS — M6289 Other specified disorders of muscle: Secondary | ICD-10-CM | POA: Insufficient documentation

## 2015-04-01 DIAGNOSIS — R262 Difficulty in walking, not elsewhere classified: Secondary | ICD-10-CM | POA: Diagnosis not present

## 2015-04-01 DIAGNOSIS — M25561 Pain in right knee: Secondary | ICD-10-CM | POA: Diagnosis not present

## 2015-04-01 DIAGNOSIS — R29898 Other symptoms and signs involving the musculoskeletal system: Secondary | ICD-10-CM

## 2015-04-01 NOTE — Therapy (Signed)
[facility] [address]  [place], [place], [address] Phone: [phone]   Fax:  [phone]  Physical Therapy Treatment  Patient Details  Name: Jennifer Harrell MRN: [id number] Date of Birth: 1966/08/31 Referring Provider:  Dene Gentry, MD  Encounter Date: 04/01/2015      PT End of Session - 04/01/15 0819    Visit Number 5   Number of Visits 12   Date for PT Re-Evaluation 04/24/15   PT Start Time 0810   PT Stop Time 0852   PT Time Calculation (min) 42 min      Past Medical History  Diagnosis Date  . Vitamin D deficiency   . Anemia   . History of breast surgery 1983    reduction  . Fibromyalgia     Past Surgical History  Procedure Laterality Date  . Breast reduction    . Bpdd weight loss surgery  2002  . Uterine ablation    . Abdominoplasty  2005  . Thigh lift      There were no vitals filed for this visit.  Visit Diagnosis:  Arthralgia of both knees  Weakness of both hips  Difficulty walking      Subjective Assessment - 04/01/15 0812    Subjective Work out this AM included great deal of burpies, squats, and some running.  States pain was mostly 2-3/10 over the weekend up to 4-5/10 with AM workout prior to PT treatment.  Hot bath after exercise reduced pain to current levels.  States is noting improvement in level of activity tolerance.   Currently in Pain? Yes   Pain Score 4    Pain Location Knee   Pain Orientation Right   Pain Score 2   Pain Location Knee   Pain Orientation Left       TODAY'S TREATMENT: TherEx - Rec Bike lvl 2, 5' DF Rocker Stretch 3x20" each Wall Sits with Black TB at knees 2x30" B Knee Flexion Machine 45# 2x15 Standing Clams Black TB 20x each SL Deadlift 8# 12x each Bridge 15x Manual - B ITB Strumming, B Patellar medial and inferior glides with instruct for self mobes.           PT Short Term Goals - 03/19/15 0845    PT SHORT TERM GOAL #1   Title pt independent with initial HEP by 03/22/15   Status Achieved           PT Long Term Goals - 04/01/15 0824    PT LONG TERM GOAL #1   Title B Hip MMT 4+/5 or better grossly to allow good hip stability and proper knee mechanics with LE exercises by 04/24/15   Status On-going   PT LONG TERM GOAL #2   Title pt states she is able to participate in exercise without noting knee pain greater than 2/10 by 04/24/15   Status On-going   PT LONG TERM GOAL #3   Title pt able to stand/walk for duration/distance not limited by knee pain by 04/24/15   Status On-going               Plan - 04/01/15 1046    Clinical Impression Statement pt reports noting improved ability with her independent workouts in that is able to do more with same amount or less knee pain.  She continues to experience B knee pain however and pain still seems primarily PFPS in nature.  Added patellar mobes to HEP to hopefully further help  with pain reduction.   PT Next Visit Plan ITB strumming, patellar medial glides, HS stretch, hip stability training - possible taping to knee(s)   Consulted and Agree with Plan of Care Patient        Problem List Patient Active Problem List   Diagnosis Date Noted  . Right hip pain 01/18/2015  . Bilateral knee pain 09/18/2014  . Internal bleeding hemorrhoids 06/20/2013  . Guaiac positive stools 02/26/2013  . Zinc deficiency 10/24/2012  . Vitamin A deficiency 08/25/2012  . Sinus bradycardia 08/15/2012  . Allergic rhinitis due to allergen 08/15/2012  . Vitamin D deficiency 05/12/2012  . History of anemia 05/10/2012  . History of fibromyalgia 05/10/2012  . Lymphedema 05/10/2012  . Malabsorption syndrome 05/10/2012  . Seborrheic dermatitis of scalp 05/10/2012  . 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 S/P endometrial ablation 05/10/2012  . History of abdominoplasty 05/10/2012  . DEFICIENCY OF OTHER VITAMINS 02/05/2010  . PREMATURE VENTRICULAR CONTRACTIONS 02/05/2010  .  BRADYCARDIA 02/05/2010    Damaree Sargent PT, OCS 04/01/2015, 10:49 AM  [facility] [place], [place], [address] Phone: [phone]   Fax:  [phone]

## 2015-04-05 ENCOUNTER — Ambulatory Visit: Payer: 59 | Admitting: Physical Therapy

## 2015-04-05 DIAGNOSIS — M25561 Pain in right knee: Secondary | ICD-10-CM

## 2015-04-05 DIAGNOSIS — R29898 Other symptoms and signs involving the musculoskeletal system: Secondary | ICD-10-CM

## 2015-04-05 DIAGNOSIS — M25562 Pain in left knee: Principal | ICD-10-CM

## 2015-04-05 DIAGNOSIS — R262 Difficulty in walking, not elsewhere classified: Secondary | ICD-10-CM

## 2015-04-05 NOTE — Therapy (Signed)
[facility] [address]  [place], [place], [address] Phone: [phone]   Fax:  [phone]  Physical Therapy Treatment  Patient Details  Name: Jennifer Harrell MRN: [id number] Date of Birth: 1966/04/13 Referring Provider:  Dene Gentry, MD  Encounter Date: 04/05/2015      PT End of Session - 04/05/15 0852    Visit Number 6   Number of Visits 12   Date for PT Re-Evaluation 04/24/15   PT Start Time 0848   PT Stop Time 0935   PT Time Calculation (min) 47 min      Past Medical History  Diagnosis Date  . Vitamin D deficiency   . Anemia   . History of breast surgery 1983    reduction  . Fibromyalgia     Past Surgical History  Procedure Laterality Date  . Breast reduction    . Bpdd weight loss surgery  2002  . Uterine ablation    . Abdominoplasty  2005  . Thigh lift      There were no vitals filed for this visit.  Visit Diagnosis:  Arthralgia of both knees  Weakness of both hips  Difficulty walking      Subjective Assessment - 04/05/15 0853    Subjective States has felt fine since last treatment.  Workout this AM included squats, burpies, box jumps, mountain climbers, and walking lunges.  She states went well without much pain increase.   Currently in Pain? Yes   Pain Score 2    Pain Location Knee   Pain Orientation Right   Pain Score 2   Pain Location Knee   Pain Orientation Left        TODAY'S TREATMENT TherEx - NuStep lvl 5, 4' Bridge 15x Single Leg Bridge 10x each Side-Lying Clam Black TB 20x each Fitter 1 Black and 1 Blue: lateral lunge 15 each, BW lunge 15x each, FW Lunge 10x - all with 2 pole A. Treadmill push and pull knee exercises 10x each HS and Prone Knee Flexion Stretches TRX DL Squat 20x B Knee Extension Machine 20# 15x  Manual - B ITB Strumming, B Patellar medial and inferior glides with instruct for self mobes.                            PT Short Term Goals - 03/19/15 0845    PT SHORT TERM GOAL #1   Title pt independent with initial HEP by 03/22/15   Status Achieved           PT Long Term Goals - 04/01/15 0824    PT LONG TERM GOAL #1   Title B Hip MMT 4+/5 or better grossly to allow good hip stability and proper knee mechanics with LE exercises by 04/24/15   Status On-going   PT LONG TERM GOAL #2   Title pt states she is able to participate in exercise without noting knee pain greater than 2/10 by 04/24/15   Status On-going   PT LONG TERM GOAL #3   Title pt able to stand/walk for duration/distance not limited by knee pain by 04/24/15   Status On-going               Plan - 04/05/15 0934    Clinical Impression Statement B PFPS.  Progressing well with current POC.  Very tight B RF noted today and added to POC.   PT Next Visit Plan ITB  strumming, patellar medial glides, Prone Knee Flexion stretch, hip stability training - possible taping to knee(s)   Consulted and Agree with Plan of Care Patient        Problem List Patient Active Problem List   Diagnosis Date Noted  . Right hip pain 01/18/2015  . Bilateral knee pain 09/18/2014  . Internal bleeding hemorrhoids 06/20/2013  . Guaiac positive stools 02/26/2013  . Zinc deficiency 10/24/2012  . Vitamin A deficiency 08/25/2012  . Sinus bradycardia 08/15/2012  . Allergic rhinitis due to allergen 08/15/2012  . Vitamin D deficiency 05/12/2012  . History of anemia 05/10/2012  . History of fibromyalgia 05/10/2012  . Lymphedema 05/10/2012  . Malabsorption syndrome 05/10/2012  . Seborrheic dermatitis of scalp 05/10/2012  . 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 S/P endometrial ablation 05/10/2012  . History of abdominoplasty 05/10/2012  . DEFICIENCY OF OTHER VITAMINS 02/05/2010  . PREMATURE VENTRICULAR CONTRACTIONS 02/05/2010  . BRADYCARDIA 02/05/2010    Deavion Strider PT, OCS 04/05/2015, 9:36 AM  [facility] [place], [place], [address] Phone: [phone]   Fax:  [phone]

## 2015-04-08 ENCOUNTER — Ambulatory Visit: Payer: 59 | Admitting: Rehabilitation

## 2015-04-08 DIAGNOSIS — M25561 Pain in right knee: Secondary | ICD-10-CM

## 2015-04-08 DIAGNOSIS — M25562 Pain in left knee: Principal | ICD-10-CM

## 2015-04-08 DIAGNOSIS — R262 Difficulty in walking, not elsewhere classified: Secondary | ICD-10-CM

## 2015-04-08 DIAGNOSIS — R29898 Other symptoms and signs involving the musculoskeletal system: Secondary | ICD-10-CM

## 2015-04-08 NOTE — Therapy (Signed)
[facility] [address]  [place], [place], [address] Phone: [phone]   Fax:  [phone]  Physical Therapy Treatment  Patient Details  Name: Jennifer Harrell MRN: [id number] Date of Birth: February 24, 1966 Referring Provider:  Dene Gentry, MD  Encounter Date: 04/08/2015      PT End of Session - 04/08/15 0805    Visit Number 7   Number of Visits 12   Date for PT Re-Evaluation 04/24/15   PT Start Time 0800   PT Stop Time 0841   PT Time Calculation (min) 41 min      Past Medical History  Diagnosis Date  . Vitamin D deficiency   . Anemia   . History of breast surgery 1983    reduction  . Fibromyalgia     Past Surgical History  Procedure Laterality Date  . Breast reduction    . Bpdd weight loss surgery  2002  . Uterine ablation    . Abdominoplasty  2005  . Thigh lift      There were no vitals filed for this visit.  Visit Diagnosis:  Arthralgia of both knees  Weakness of both hips  Difficulty walking      Subjective Assessment - 04/08/15 0803    Subjective Reports she didn't work out this morning and that her knees have bothered her over the weekend. Not sure for the reason but thinks it might have been from the new things on Friday.   Currently in Pain? Yes   Pain Score 4    Pain Location Knee   Pain Orientation Right   Multiple Pain Sites Yes   Pain Score 3   Pain Location Knee   Pain Orientation Left      TODAY'S TREATMENT TherEx - Rec Bike level 2x5' HS and Prone Knee Flexion Stretches Bridge 15x Single Leg Bridge 10x each Side-Lying Clam Black TB 20x each TRX DL Squats 15x BOSU Fwd step up with 5" hold x10, Lateral step up with 5" hold x10 each  Manual - B ITB Strumming, B Patellar medial and inferior glides with instruct for self mobes.          PT Short Term Goals - 03/19/15 0845    PT SHORT TERM GOAL #1   Title pt independent with initial HEP by 03/22/15   Status  Achieved           PT Long Term Goals - 04/01/15 0824    PT LONG TERM GOAL #1   Title B Hip MMT 4+/5 or better grossly to allow good hip stability and proper knee mechanics with LE exercises by 04/24/15   Status On-going   PT LONG TERM GOAL #2   Title pt states she is able to participate in exercise without noting knee pain greater than 2/10 by 04/24/15   Status On-going   PT LONG TERM GOAL #3   Title pt able to stand/walk for duration/distance not limited by knee pain by 04/24/15   Status On-going               Plan - 04/08/15 0841    Clinical Impression Statement Less intensity today due to increased pain. Pt tolerate treatment well with no increased pain.    PT Next Visit Plan ITB strumming, patellar medial glides, Prone Knee Flexion stretch, hip stability training - possible taping to knee(s)   Consulted and Agree with Plan of Care Patient  Problem List Patient Active Problem List   Diagnosis Date Noted  . Right hip pain 01/18/2015  . Bilateral knee pain 09/18/2014  . Internal bleeding hemorrhoids 06/20/2013  . Guaiac positive stools 02/26/2013  . Zinc deficiency 10/24/2012  . Vitamin A deficiency 08/25/2012  . Sinus bradycardia 08/15/2012  . Allergic rhinitis due to allergen 08/15/2012  . Vitamin D deficiency 05/12/2012  . History of anemia 05/10/2012  . History of fibromyalgia 05/10/2012  . Lymphedema 05/10/2012  . Malabsorption syndrome 05/10/2012  . Seborrheic dermatitis of scalp 05/10/2012  . 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 S/P endometrial ablation 05/10/2012  . History of abdominoplasty 05/10/2012  . DEFICIENCY OF OTHER VITAMINS 02/05/2010  . PREMATURE VENTRICULAR CONTRACTIONS 02/05/2010  . BRADYCARDIA 02/05/2010    Barbette Hair, PTA 04/08/2015, 8:42 AM  [facility] [place] [facility] [place], [place], [address] Phone: [phone]   Fax:  [phone]

## 2015-04-11 ENCOUNTER — Ambulatory Visit: Payer: 59 | Admitting: Physical Therapy

## 2015-04-11 DIAGNOSIS — M25561 Pain in right knee: Secondary | ICD-10-CM | POA: Diagnosis not present

## 2015-04-11 DIAGNOSIS — R29898 Other symptoms and signs involving the musculoskeletal system: Secondary | ICD-10-CM

## 2015-04-11 DIAGNOSIS — M25562 Pain in left knee: Principal | ICD-10-CM

## 2015-04-11 NOTE — Therapy (Signed)
[facility] [address]  [place], [place], [address] Phone: [phone]   Fax:  [phone]  Physical Therapy Treatment  Patient Details  Name: Jennifer Harrell MRN: [id number] Date of Birth: 29-May-1966 Referring Provider:  Dene Gentry, MD  Encounter Date: 04/11/2015      PT End of Session - 04/11/15 0814    Visit Number 8   Number of Visits 12   Date for PT Re-Evaluation 04/24/15   PT Start Time 0800   PT Stop Time 0849   PT Time Calculation (min) 49 min      Past Medical History  Diagnosis Date  . Vitamin D deficiency   . Anemia   . History of breast surgery 1983    reduction  . Fibromyalgia     Past Surgical History  Procedure Laterality Date  . Breast reduction    . Bpdd weight loss surgery  2002  . Uterine ablation    . Abdominoplasty  2005  . Thigh lift      There were no vitals filed for this visit.  Visit Diagnosis:  Arthralgia of both knees  Weakness of both hips      Subjective Assessment - 04/11/15 0801    Subjective States standing and walking no longer produce knee pain.   Currently in Pain? Yes   Pain Score 3    Pain Location Knee   Pain Orientation Right   Pain Score 1   Pain Location Knee   Pain Orientation Left       TODAY'S TREATMENT TherEx - Rec Bike level 2x4' HS and Prone Knee Flexion Stretches (Contract/Relax with Prone Knee Flexion and displays excellent progress with this) Bridge 15x Bridge with ALT Knee Ext with Black TB at knees 10x Side-Lying Clam Black TB 15x each TRX DL Squats 15x TRX Lunges 10x each Knee Flexion Machine 35# 15x Knee Extension Machine 25# 15x 8" Side Step-up 15x each with Single Pole A Manual - B ITB Strumming, B Patellar medial and inferior glides.        PT Short Term Goals - 03/19/15 0845    PT SHORT TERM GOAL #1   Title pt independent with initial HEP by 03/22/15   Status Achieved           PT Long Term  Goals - 04/11/15 0819    PT LONG TERM GOAL #1   Title B Hip MMT 4+/5 or better grossly to allow good hip stability and proper knee mechanics with LE exercises by 04/24/15  L Hip ABD still 4/5, did not assess Hip Ext or B knees today   Status On-going   PT LONG TERM GOAL #2   Title pt states she is able to participate in exercise without noting knee pain greater than 2/10 by 04/24/15   Status On-going   PT LONG TERM GOAL #3   Title pt able to stand/walk for duration/distance not limited by knee pain by 04/24/15   Status Achieved               Plan - 04/11/15 0855    Clinical Impression Statement Improved B hip strength other than L hip ABD still 4/5 (B hip extension not assessed today).  Pt seems to have progressed well and is nearing goals/discharge.  Prone knee flexion with contract/relax progressed to near full ROM (initial stretch was approx 90 degrees but heel nearly to buttock after 4 contract/relax each knee).   PT  Next Visit Plan Assess B Hip Ext and B Knee MMT.  Question pt regarding exercise tolerance and knee pain.  Is due g-code in 2 visits - may be ready for DC by then?   Consulted and Agree with Plan of Care Patient        Problem List Patient Active Problem List   Diagnosis Date Noted  . Right hip pain 01/18/2015  . Bilateral knee pain 09/18/2014  . Internal bleeding hemorrhoids 06/20/2013  . Guaiac positive stools 02/26/2013  . Zinc deficiency 10/24/2012  . Vitamin A deficiency 08/25/2012  . Sinus bradycardia 08/15/2012  . Allergic rhinitis due to allergen 08/15/2012  . Vitamin D deficiency 05/12/2012  . History of anemia 05/10/2012  . History of fibromyalgia 05/10/2012  . Lymphedema 05/10/2012  . Malabsorption syndrome 05/10/2012  . Seborrheic dermatitis of scalp 05/10/2012  . 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 S/P endometrial ablation 05/10/2012  . History of abdominoplasty 05/10/2012  . DEFICIENCY OF OTHER VITAMINS 02/05/2010  . PREMATURE  VENTRICULAR CONTRACTIONS 02/05/2010  . BRADYCARDIA 02/05/2010    Dantonio Justen PT, OCS 04/11/2015, 9:03 AM  [facility] [place], [place], [address] Phone: [phone]   Fax:  [phone]

## 2015-04-15 ENCOUNTER — Ambulatory Visit: Payer: 59 | Admitting: Physical Therapy

## 2015-04-15 DIAGNOSIS — M25561 Pain in right knee: Secondary | ICD-10-CM

## 2015-04-15 DIAGNOSIS — M25562 Pain in left knee: Principal | ICD-10-CM

## 2015-04-15 NOTE — Therapy (Signed)
[facility] [address]  [place], [place], [address] Phone: [phone]   Fax:  [phone]  Physical Therapy Treatment  Patient Details  Name: Jennifer Harrell MRN: [id number] Date of Birth: 11-02-1966 Referring Provider:  Dene Gentry, MD  Encounter Date: 04/15/2015      PT End of Session - 04/15/15 0848    Visit Number 9   Number of Visits 12   Date for PT Re-Evaluation 04/24/15   PT Start Time 0846   PT Stop Time 0933   PT Time Calculation (min) 47 min      Past Medical History  Diagnosis Date  . Vitamin D deficiency   . Anemia   . History of breast surgery 1983    reduction  . Fibromyalgia     Past Surgical History  Procedure Laterality Date  . Breast reduction    . Bpdd weight loss surgery  2002  . Uterine ablation    . Abdominoplasty  2005  . Thigh lift      There were no vitals filed for this visit.  Visit Diagnosis:  Arthralgia of both knees      Subjective Assessment - 04/15/15 0849    Subjective no L knee pain today but 2/10 to R knee   Currently in Pain? Yes   Pain Score 2    Pain Location Knee   Pain Orientation Right   Multiple Pain Sites No        TODAY'S TREATMENT TherEx - Rec Bike, INT, 2/4, 6' HS and Prone Knee Flexion Stretches (Contract/Relax with Prone Knee Flexion, R slight tight vs L) Prone SLR 10x each Plank with Alt LE raise 3x3 Side Bridge 8x each FOB (65cm) Bridge 10x FOB (65cm) Bridge w/ Tuck 12x Rocker DF Stretch 8" FW Step-up with Black TB pulling knee medially 2x15 each Knee Extension Machine 25# 15x, 35# 15x Side-Stepping Blue TB at forefoot Doorway Lunges 10x each            PT Short Term Goals - 03/19/15 0845    PT SHORT TERM GOAL #1   Title pt independent with initial HEP by 03/22/15   Status Achieved           PT Long Term Goals - 04/11/15 0819    PT LONG TERM GOAL #1   Title B Hip MMT 4+/5 or better grossly to allow  good hip stability and proper knee mechanics with LE exercises by 04/24/15  L Hip ABD still 4/5, did not assess Hip Ext or B knees today   Status On-going   PT LONG TERM GOAL #2   Title pt states she is able to participate in exercise without noting knee pain greater than 2/10 by 04/24/15   Status On-going   PT LONG TERM GOAL #3   Title pt able to stand/walk for duration/distance not limited by knee pain by 04/24/15   Status Achieved               Plan - 04/15/15 0934    Clinical Impression Statement no pain L knee, only 2/10 in R.  Performed very well with today's treatment.  Mild tightness R RF with prone knee flexion.  Nearing discharge.   PT Next Visit Plan G-Code.  Assess B Hip Ext and B Knee MMT.  Question pt regarding exercise tolerance and knee pain.   Consulted and Agree with Plan of Care Patient  Problem List Patient Active Problem List   Diagnosis Date Noted  . Right hip pain 01/18/2015  . Bilateral knee pain 09/18/2014  . Internal bleeding hemorrhoids 06/20/2013  . Guaiac positive stools 02/26/2013  . Zinc deficiency 10/24/2012  . Vitamin A deficiency 08/25/2012  . Sinus bradycardia 08/15/2012  . Allergic rhinitis due to allergen 08/15/2012  . Vitamin D deficiency 05/12/2012  . History of anemia 05/10/2012  . History of fibromyalgia 05/10/2012  . Lymphedema 05/10/2012  . Malabsorption syndrome 05/10/2012  . Seborrheic dermatitis of scalp 05/10/2012  . 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 S/P endometrial ablation 05/10/2012  . History of abdominoplasty 05/10/2012  . DEFICIENCY OF OTHER VITAMINS 02/05/2010  . PREMATURE VENTRICULAR CONTRACTIONS 02/05/2010  . BRADYCARDIA 02/05/2010    Ulysses Alper PT, OCS 04/15/2015, 9:36 AM  [facility] [place], [place], [address] Phone: [phone]   Fax:  [phone]

## 2015-04-18 ENCOUNTER — Ambulatory Visit: Payer: 59 | Admitting: Rehabilitation

## 2015-04-18 DIAGNOSIS — M25561 Pain in right knee: Secondary | ICD-10-CM

## 2015-04-18 DIAGNOSIS — M25562 Pain in left knee: Principal | ICD-10-CM

## 2015-04-18 NOTE — Therapy (Signed)
[facility] [address]  [place], [place], [address] Phone: [phone]   Fax:  [phone]  Physical Therapy Treatment  Patient Details  Name: Jennifer Harrell MRN: [id number] Date of Birth: [birth year] Referring Provider:  Dene Gentry, MD  Encounter Date: 04/18/2015      PT End of Session - 04/18/15 0844    Visit Number 10   Number of Visits 12   Date for PT Re-Evaluation 04/24/15   PT Start Time 0839   PT Stop Time 0921   PT Time Calculation (min) 42 min      Past Medical History  Diagnosis Date  . Vitamin D deficiency   . Anemia   . History of breast surgery 1983    reduction  . Fibromyalgia     Past Surgical History  Procedure Laterality Date  . Breast reduction    . Bpdd weight loss surgery  2002  . Uterine ablation    . Abdominoplasty  2005  . Thigh lift      There were no vitals filed for this visit.  Visit Diagnosis:  Arthralgia of both knees      Subjective Assessment - 04/18/15 0842    Subjective Reports feeling fine today, has been sick this week so hasn't been exercising. States she feels like her knees are improving and that her endurance has definitely improved. States the pain isn't as bad or as often.    Currently in Pain? Yes   Pain Score 2    Pain Location Knee   Pain Orientation Right            [facility] PT Assessment - 04/18/15 0001    Observation/Other Assessments   Focus on Therapeutic Outcomes (FOTO)  32% limitation   Strength   Right Hip Extension 4/5   Left Hip Extension 4+/5   Right Knee Flexion 4+/5   Right Knee Extension 4+/5   Left Knee Flexion 4+/5   Left Knee Extension 4/5        TODAY'S TREATMENT TherEx - Rec Bike, INT, 2/5, 6' MMT-Bil Knee and Bil Hip Ext FOB (65cm) Bridge 15x HS and Prone Knee Flexion Stretches (Contract/Relax with Prone Knee Flexion, R slight tight vs L) Prone SLR 12x each Plank with Alt LE raise 4x3 Doorway Lunges  12x each Side-Stepping Blue TB at forefoot 23ftx2 Side Bridge 8x each TRX DL Squat 15x           PT Short Term Goals - 03/19/15 0845    PT SHORT TERM GOAL #1   Title pt independent with initial HEP by 03/22/15   Status Achieved           PT Long Term Goals - 04/11/15 0819    PT LONG TERM GOAL #1   Title B Hip MMT 4+/5 or better grossly to allow good hip stability and proper knee mechanics with LE exercises by 04/24/15  L Hip ABD still 4/5, did not assess Hip Ext or B knees today   Status On-going   PT LONG TERM GOAL #2   Title pt states she is able to participate in exercise without noting knee pain greater than 2/10 by 04/24/15   Status On-going   PT LONG TERM GOAL #3   Title pt able to stand/walk for duration/distance not limited by knee pain by 04/24/15   Status Achieved               Plan - 04/18/15  7408    Clinical Impression Statement Still notable weakness between Rt and Lt hip Ext but improved knee MMTing. Good tolerance to today's treatment dispite pt feeling under the weather.    PT Next Visit Plan Continue per plan of care.    Consulted and Agree with Plan of Care Patient        Problem List Patient Active Problem List   Diagnosis Date Noted  . Right hip pain 01/18/2015  . Bilateral knee pain 09/18/2014  . Internal bleeding hemorrhoids 06/20/2013  . Guaiac positive stools 02/26/2013  . Zinc deficiency 10/24/2012  . Vitamin A deficiency 08/25/2012  . Sinus bradycardia 08/15/2012  . Allergic rhinitis due to allergen 08/15/2012  . Vitamin D deficiency 05/12/2012  . History of anemia 05/10/2012  . History of fibromyalgia 05/10/2012  . Lymphedema 05/10/2012  . Malabsorption syndrome 05/10/2012  . Seborrheic dermatitis of scalp 05/10/2012  . 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 S/P endometrial ablation 05/10/2012  . History of abdominoplasty 05/10/2012  . DEFICIENCY OF OTHER VITAMINS 02/05/2010  . PREMATURE VENTRICULAR CONTRACTIONS  02/05/2010  . BRADYCARDIA 02/05/2010    Barbette Hair 04/18/2015, 9:26 AM  [facility] [place], [place], [address] Phone: [phone]   Fax:  [phone]

## 2015-04-18 NOTE — Therapy (Deleted)
[facility] [address]  [place], [place], [address] Phone: [phone]   Fax:  [phone]  Physical Therapy Treatment  Patient Details  Name: Jennifer Harrell MRN: [id number] Date of Birth: [birth year] Referring Provider:  Dene Gentry, MD  Encounter Date: 04/18/2015      PT End of Session - 04/18/15 0844    Visit Number 10   Number of Visits 12   Date for PT Re-Evaluation 04/24/15   PT Start Time 0839   PT Stop Time 0921   PT Time Calculation (min) 42 min      Past Medical History  Diagnosis Date  . Vitamin D deficiency   . Anemia   . History of breast surgery 1983    reduction  . Fibromyalgia     Past Surgical History  Procedure Laterality Date  . Breast reduction    . Bpdd weight loss surgery  2002  . Uterine ablation    . Abdominoplasty  2005  . Thigh lift      There were no vitals filed for this visit.  Visit Diagnosis:  Arthralgia of both knees      Subjective Assessment - 04/18/15 0842    Subjective Reports feeling fine today, has been sick this week so hasn't been exercising. States she feels like her knees are improving and that her endurance has definitely improved. States the pain isn't as bad or as often.    Currently in Pain? Yes   Pain Score 2    Pain Location Knee   Pain Orientation Right       TODAY'S TREATMENT TherEx - Rec Bike, INT, 2/5, 6' MMT-Bil Knee and Bil Hip Ext FOB (65cm) Bridge 15x HS and Prone Knee Flexion Stretches (Contract/Relax with Prone Knee Flexion, R slight tight vs L) Prone SLR 12x each Plank with Alt LE raise 4x3 Doorway Lunges 12x each Side-Stepping Blue TB at forefoot 77ftx2 Side Bridge 8x each TRX DL Squat  15x           PT Short Term Goals - 03/19/15 0845    PT SHORT TERM GOAL #1   Title pt independent with initial HEP by 03/22/15   Status Achieved           PT Long Term Goals - 04/11/15 0819    PT LONG TERM GOAL #1   Title B Hip MMT 4+/5 or better grossly to allow good hip stability and proper knee mechanics with LE exercises by 04/24/15  L Hip ABD still 4/5, did not assess Hip Ext or B knees today   Status On-going   PT LONG TERM GOAL #2   Title pt states she is able to participate in exercise without noting knee pain greater than 2/10 by 04/24/15   Status On-going   PT LONG TERM GOAL #3   Title pt able to stand/walk for duration/distance not limited by knee pain by 04/24/15   Status Achieved               Plan - 04/18/15 0919    Clinical Impression Statement Still notable weakness between Rt and Lt hip Ext but improved knee MMTing. Good tolerance to today's treatment dispite pt feeling under the weather.    PT Next Visit Plan Continue per plan of care.    Consulted and Agree with Plan of Care Patient        Problem List Patient Active Problem List   Diagnosis Date Noted  .  Right hip pain 01/18/2015  . Bilateral knee pain 09/18/2014  . Internal bleeding hemorrhoids 06/20/2013  . Guaiac positive stools 02/26/2013  . Zinc deficiency 10/24/2012  . Vitamin A deficiency 08/25/2012  . Sinus bradycardia 08/15/2012  . Allergic rhinitis due to allergen 08/15/2012  . Vitamin D deficiency 05/12/2012  . History of anemia 05/10/2012  . History of fibromyalgia 05/10/2012  . Lymphedema 05/10/2012  . Malabsorption syndrome 05/10/2012  . Seborrheic dermatitis of scalp 05/10/2012  . 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 S/P endometrial ablation 05/10/2012  . History of abdominoplasty 05/10/2012  . DEFICIENCY OF OTHER VITAMINS 02/05/2010  . PREMATURE VENTRICULAR CONTRACTIONS 02/05/2010  . BRADYCARDIA 02/05/2010    Barbette Hair, PTA 04/18/2015, 9:24 AM  [facility] [place], [place], [address] Phone: [phone]   Fax:  [phone]

## 2015-04-22 ENCOUNTER — Ambulatory Visit: Payer: 59 | Admitting: Rehabilitation

## 2015-04-22 DIAGNOSIS — M25561 Pain in right knee: Secondary | ICD-10-CM | POA: Diagnosis not present

## 2015-04-22 DIAGNOSIS — R29898 Other symptoms and signs involving the musculoskeletal system: Secondary | ICD-10-CM

## 2015-04-22 DIAGNOSIS — M25562 Pain in left knee: Principal | ICD-10-CM

## 2015-04-22 NOTE — Therapy (Signed)
[facility] [address]  [place], [place], [address] Phone: [phone]   Fax:  [phone]  Physical Therapy Treatment  Patient Details  Name: Jennifer Harrell MRN: [id number] Date of Birth: [birth year] Referring Provider:  Dene Gentry, MD  Encounter Date: 04/22/2015      PT End of Session - 04/22/15 0804    Visit Number 11   Number of Visits 12   Date for PT Re-Evaluation 04/24/15   PT Start Time 0800   PT Stop Time 0839   PT Time Calculation (min) 39 min      Past Medical History  Diagnosis Date  . Vitamin D deficiency   . Anemia   . History of breast surgery 1983    reduction  . Fibromyalgia     Past Surgical History  Procedure Laterality Date  . Breast reduction    . Bpdd weight loss surgery  2002  . Uterine ablation    . Abdominoplasty  2005  . Thigh lift      There were no vitals filed for this visit.  Visit Diagnosis:  Arthralgia of both knees  Weakness of both hips      Subjective Assessment - 04/22/15 0802    Subjective Reports feeling back to normal now, started back working out this morning. Reports her knees over the past few weeks haven't gotten above a 2/10 while working out. Worst pain in the past 2 weeks has been 4/10.    Currently in Pain? Yes   Pain Score 2    Pain Location Knee   Pain Orientation Right;Left;Medial      TODAY'S TREATMENT TherEx - Rec Bike, INT, 2/5, 6' HS and Prone Knee Flexion Stretches (Contract/Relax with Prone Knee Flexion) Prone SLR 2x12  TRX DL Squat 15x TRX BW Lunges 10x each Side Bridge 10x each 8" Side Step-ups 15x each 8" Eccentric Reach downs 15x with single pole A Side-Stepping Blue TB at forefoot 66ftx2 TM Hamstring pull x10 each        PT Short Term Goals - 04/22/15 0819    PT SHORT TERM GOAL #1   Title pt independent with initial HEP by 03/22/15   Status Achieved           PT Long Term Goals - 04/22/15 0819    PT LONG TERM GOAL #1   Title B Hip MMT 4+/5 or better grossly to allow good hip stability and proper knee mechanics with LE exercises by 04/24/15  Still lacking with hip abd and ext 4/5   Status On-going   PT LONG TERM GOAL #2   Title pt states she is able to participate in exercise without noting knee pain greater than 2/10 by 04/24/15   Status Achieved   PT LONG TERM GOAL #3   Title pt able to stand/walk for duration/distance not limited by knee pain by 04/24/15   Status Achieved               Plan - 04/22/15 0840    Clinical Impression Statement Good tolerance to all exercises with slight pain noted with hamstring pull and TRX lunges. Pt able to tolerate todays treatment in conjunction with her normal gym routine. Pt to decide if she would like to continue or stop PT by next appointment.    PT Next Visit Plan Continue vs D/C at next visit.         Problem List Patient Active Problem List  Diagnosis Date Noted  . Right hip pain 01/18/2015  . Bilateral knee pain 09/18/2014  . Internal bleeding hemorrhoids 06/20/2013  . Guaiac positive stools 02/26/2013  . Zinc deficiency 10/24/2012  . Vitamin A deficiency 08/25/2012  . Sinus bradycardia 08/15/2012  . Allergic rhinitis due to allergen 08/15/2012  . Vitamin D deficiency 05/12/2012  . History of anemia 05/10/2012  . History of fibromyalgia 05/10/2012  . Lymphedema 05/10/2012  . Malabsorption syndrome 05/10/2012  . Seborrheic dermatitis of scalp 05/10/2012  . 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 S/P endometrial ablation 05/10/2012  . History of abdominoplasty 05/10/2012  . DEFICIENCY OF OTHER VITAMINS 02/05/2010  . PREMATURE VENTRICULAR CONTRACTIONS 02/05/2010  . BRADYCARDIA 02/05/2010    Barbette Hair, PTA 04/22/2015, 8:43 AM  [facility] [place], [place], [address] Phone: [phone]   Fax:  [phone]

## 2015-04-25 ENCOUNTER — Encounter: Payer: 59 | Admitting: Rehabilitation

## 2015-04-26 ENCOUNTER — Ambulatory Visit: Payer: 59 | Admitting: Rehabilitation

## 2015-04-26 DIAGNOSIS — M25562 Pain in left knee: Principal | ICD-10-CM

## 2015-04-26 DIAGNOSIS — M25561 Pain in right knee: Secondary | ICD-10-CM | POA: Diagnosis not present

## 2015-04-26 DIAGNOSIS — R29898 Other symptoms and signs involving the musculoskeletal system: Secondary | ICD-10-CM

## 2015-04-26 NOTE — Therapy (Signed)
[facility] [address]  [place], [place], [address] Phone: [phone]   Fax:  [phone]  Physical Therapy Treatment  Patient Details  Name: Jennifer Harrell MRN: [id number] Date of Birth: 1966/06/09 Referring Provider:  Dene Gentry, MD  Encounter Date: 04/26/2015      PT End of Session - 04/26/15 0804    Visit Number 12   Number of Visits 12   Date for PT Re-Evaluation 04/24/15   PT Start Time 0800   PT Stop Time 0841   PT Time Calculation (min) 41 min      Past Medical History  Diagnosis Date  . Vitamin D deficiency   . Anemia   . History of breast surgery 1983    reduction  . Fibromyalgia     Past Surgical History  Procedure Laterality Date  . Breast reduction    . Bpdd weight loss surgery  2002  . Uterine ablation    . Abdominoplasty  2005  . Thigh lift      There were no vitals filed for this visit.  Visit Diagnosis:  Arthralgia of both knees  Weakness of both hips      Subjective Assessment - 04/26/15 0803    Subjective Reports she was fine after last time no increased pain. No pain this morning due to doing only upper body at the gym. Pain at best: 1/10; at worst: 4-5/10. Knee pain typically increases after sitting for a long period of time and doesn't bother her as frequently with working out but can still cause pain with squatting and lunging activities. Pt reports she is unsure what causes her ITB pain and doesn't see any consistancy with it.   Currently in Pain? No/denies            [facility] PT Assessment - 04/26/15 2878    Strength   Strength Assessment Site Knee;Hip   Right/Left Hip Right;Left   Right Hip Flexion 5/5   Right Hip Extension 4/5   Right Hip External Rotation  --  5/5   Right Hip Internal Rotation  --  5/5   Right Hip ABduction --  5-/5   Left Hip Flexion 5/5   Left Hip Extension 4/5   Left Hip External Rotation  --  5/5   Left Hip Internal  Rotation  --  5/5   Left Hip ABduction --  5-/5   Right/Left Knee Right;Left   Right Knee Flexion 4+/5   Right Knee Extension 4+/5  slight anterior knee pain   Left Knee Flexion 4+/5   Left Knee Extension 4+/5  slight anterior knee pain          TODAY'S TREATMENT TherEx - Rec Bike, INT, 2/5, 6' MMT/Discussion for renewal or d/c.  HS and Prone Knee Flexion Stretches (Contract/Relax with Prone Knee Flexion) Prone SLR 2# x10 Prone Hip Ext with knee bent 0# x10  Fitter BW Lunge 1 Black + 1 Blue with 2 Pole A 10x each  Fitter Lateral Lunge 1 Black + 1 Blue with 2 Pole A 10x each  TRX DL Squat 15x Side-stepping with squat and green TB at forefoot x65f each         PT Short Term Goals - 04/22/15 0819    PT SHORT TERM GOAL #1   Title pt independent with initial HEP by 03/22/15   Status Achieved           PT Long Term Goals -  04/26/15 0843    PT LONG TERM GOAL #1   Title B Hip MMT 4+/5 or better grossly to allow good hip stability and proper knee mechanics with LE exercises by 04/24/15  Met all except for hip extension, bil   Status Partially Met   PT LONG TERM GOAL #2   Title pt states she is able to participate in exercise without noting knee pain greater than 2/10 by 04/24/15   Status Achieved   PT LONG TERM GOAL #3   Title pt able to stand/walk for duration/distance not limited by knee pain by 04/24/15   Status Achieved               Plan - 04/26/15 0840    Clinical Impression Statement Jennifer Harrell has progressed very well to date.  Her pain is often 0-1/10 lately.  Improvements noted with body mechanics and hip MMT testing but hip extension still around 4/5. Pt has requested to continue with PT to address her hip weakness and knee pain which continue to limit her overall level of function.   Pt will benefit from skilled therapeutic intervention in order to improve on the following deficits Pain;Improper body mechanics;Difficulty walking;Decreased  strength;Obesity;Decreased balance   Rehab Potential Excellent   PT Frequency 2x / week   PT Duration 3 weeks   PT Treatment/Interventions Therapeutic exercise;Manual techniques;Dry needling;Balance training;Gait training;Therapeutic activities;ADLs/Self Care Home Management;Patient/family education;Electrical Stimulation;Cryotherapy;Moist Heat;Ultrasound   PT Next Visit Plan Continue with POC   Consulted and Agree with Plan of Care Patient        Problem List Patient Active Problem List   Diagnosis Date Noted  . Right hip pain 01/18/2015  . Bilateral knee pain 09/18/2014  . Internal bleeding hemorrhoids 06/20/2013  . Guaiac positive stools 02/26/2013  . Zinc deficiency 10/24/2012  . Vitamin A deficiency 08/25/2012  . Sinus bradycardia 08/15/2012  . Allergic rhinitis due to allergen 08/15/2012  . Vitamin D deficiency 05/12/2012  . History of anemia 05/10/2012  . History of fibromyalgia 05/10/2012  . Lymphedema 05/10/2012  . Malabsorption syndrome 05/10/2012  . Seborrheic dermatitis of scalp 05/10/2012  . 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 S/P endometrial ablation 05/10/2012  . History of abdominoplasty 05/10/2012  . DEFICIENCY OF OTHER VITAMINS 02/05/2010  . PREMATURE VENTRICULAR CONTRACTIONS 02/05/2010  . BRADYCARDIA 02/05/2010    Barbette Hair, PTA  04/26/2015, 12:10 PM  Leonette Most, PT, OCS 04/26/2015 12:21 PM   [facility] [address]  [place], [place], [address] Phone: [phone]   Fax:  [phone]

## 2015-04-30 ENCOUNTER — Ambulatory Visit: Payer: 59 | Admitting: Rehabilitation

## 2015-04-30 DIAGNOSIS — M25561 Pain in right knee: Secondary | ICD-10-CM

## 2015-04-30 DIAGNOSIS — R29898 Other symptoms and signs involving the musculoskeletal system: Secondary | ICD-10-CM

## 2015-04-30 DIAGNOSIS — R262 Difficulty in walking, not elsewhere classified: Secondary | ICD-10-CM

## 2015-04-30 DIAGNOSIS — M25562 Pain in left knee: Principal | ICD-10-CM

## 2015-04-30 NOTE — Therapy (Signed)
[facility] [address]  [place], [place], [address] Phone: [phone]   Fax:  [phone]  Physical Therapy Treatment  Patient Details  Name: Jennifer Harrell MRN: [id number] Date of Birth: 31-Jan-1966 Referring Provider:  Dene Gentry, MD  Encounter Date: 04/30/2015      PT End of Session - 04/30/15 0810    Visit Number 13   Number of Visits 19   Date for PT Re-Evaluation 05/17/15   PT Start Time 0803   PT Stop Time 0034   PT Time Calculation (min) 41 min      Past Medical History  Diagnosis Date  . Vitamin D deficiency   . Anemia   . History of breast surgery 1983    reduction  . Fibromyalgia     Past Surgical History  Procedure Laterality Date  . Breast reduction    . Bpdd weight loss surgery  2002  . Uterine ablation    . Abdominoplasty  2005  . Thigh lift      There were no vitals filed for this visit.  Visit Diagnosis:  Arthralgia of both knees  Weakness of both hips  Difficulty walking      Subjective Assessment - 04/30/15 0808    Subjective Reports doing about 2 miles of walking Saturday and Monday. States they have been stiff and sore since Friday.    Currently in Pain? Yes   Pain Score --  4/10 Lt, 2/10 Rt   Pain Location Knee   Pain Orientation Right;Left;Lateral      TODAY'S TREATMENT TherEx - Rec Bike, INT, 2/5, 6'  HS and Prone Knee Flexion Stretches Prone SLR x15 FOB (65cm) Bridge x12 Side-Stepping Blue TB at forefoot 66ft x2 Doorway Lunges x10  Manual- ITB STM and Strumming to bilateral ITB    Patellar inferior and medial mobs.          PT Short Term Goals - 04/22/15 0819    PT SHORT TERM GOAL #1   Title pt independent with initial HEP by 03/22/15   Status Achieved           PT Long Term Goals - 04/30/15 0811    PT LONG TERM GOAL #1   Title B Hip MMT 4+/5 or better grossly to allow good hip stability and proper knee mechanics with LE  exercises by 05/17/15   PT LONG TERM GOAL #2   Title pt states she is able to participate in exercise without noting knee pain greater than 2/10   Status Achieved   PT LONG TERM GOAL #3   Title pt able to stand/walk for duration/distance not limited by knee pain    Status Achieved               Plan - 04/30/15 0845    Clinical Impression Statement More work on ITB today due to lateral pain on bilateral knees.    PT Next Visit Plan Continue with POC        Problem List Patient Active Problem List   Diagnosis Date Noted  . Right hip pain 01/18/2015  . Bilateral knee pain 09/18/2014  . Internal bleeding hemorrhoids 06/20/2013  . Guaiac positive stools 02/26/2013  . Zinc deficiency 10/24/2012  . Vitamin A deficiency 08/25/2012  . Sinus bradycardia 08/15/2012  . Allergic rhinitis due to allergen 08/15/2012  . Vitamin D deficiency 05/12/2012  . History of anemia 05/10/2012  . History of fibromyalgia 05/10/2012  .  Lymphedema 05/10/2012  . Malabsorption syndrome 05/10/2012  . Seborrheic dermatitis of scalp 05/10/2012  . 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 S/P endometrial ablation 05/10/2012  . History of abdominoplasty 05/10/2012  . DEFICIENCY OF OTHER VITAMINS 02/05/2010  . PREMATURE VENTRICULAR CONTRACTIONS 02/05/2010  . BRADYCARDIA 02/05/2010    Barbette Hair, PTA 04/30/2015, 8:46 AM  [facility] [place], [place], [address] Phone: [phone]   Fax:  [phone]

## 2015-05-03 ENCOUNTER — Ambulatory Visit: Payer: 59 | Attending: Family Medicine | Admitting: Rehabilitation

## 2015-05-03 DIAGNOSIS — R29898 Other symptoms and signs involving the musculoskeletal system: Secondary | ICD-10-CM

## 2015-05-03 DIAGNOSIS — M6289 Other specified disorders of muscle: Secondary | ICD-10-CM | POA: Diagnosis present

## 2015-05-03 DIAGNOSIS — M25562 Pain in left knee: Secondary | ICD-10-CM | POA: Diagnosis present

## 2015-05-03 DIAGNOSIS — M25561 Pain in right knee: Secondary | ICD-10-CM | POA: Diagnosis present

## 2015-05-03 DIAGNOSIS — R262 Difficulty in walking, not elsewhere classified: Secondary | ICD-10-CM | POA: Insufficient documentation

## 2015-05-03 NOTE — Therapy (Signed)
[facility] [address]  [place], [place], [address] Phone: [phone]   Fax:  [phone]  Physical Therapy Treatment  Patient Details  Name: Jennifer Harrell MRN: [id number] Date of Birth: Apr 16, 1966 Referring Provider:  Dene Gentry, MD  Encounter Date: 05/03/2015      PT End of Session - 05/03/15 0801    Visit Number 14   Number of Visits 19   Date for PT Re-Evaluation 05/17/15   PT Start Time 0800   PT Stop Time 0846   PT Time Calculation (min) 46 min   Activity Tolerance Patient tolerated treatment well   Behavior During Therapy [facility] for tasks assessed/performed      Past Medical History  Diagnosis Date  . Vitamin D deficiency   . Anemia   . History of breast surgery 1983    reduction  . Fibromyalgia     Past Surgical History  Procedure Laterality Date  . Breast reduction    . Bpdd weight loss surgery  2002  . Uterine ablation    . Abdominoplasty  2005  . Thigh lift      There were no vitals filed for this visit.  Visit Diagnosis:  Arthralgia of both knees  Weakness of both hips  Difficulty walking      Subjective Assessment - 05/03/15 0803    Subjective States she did a lot of leg work this morning so her legs are a little tired. Reports no increased pain after last time.    Currently in Pain? Yes   Pain Score 2    Pain Location Knee   Pain Orientation Right;Left;Lateral      TODAY'S TREATMENT TherEx - Rec Bike, INT, 2/5, 6'  HS, ITB, and Prone Knee Flexion Stretches FOB (65cm) Bridge x15 SL Bridges x10 each Side Bridges with Hip Abd x10 each Bridges with Alt Knee Ext x10 each SL Deadlift 8# x2 (pt having difficulty balancing switch to BOSU) BOSU (up) Step up with 5" hold x10 each with Single pole assist Side-stepping with Blue TB at forefoot 41ftx2  Manual- ITB STM and Strumming to bilateral ITB  Patellar inferior and medial mobs.          PT  Short Term Goals - 04/22/15 0819    PT SHORT TERM GOAL #1   Title pt independent with initial HEP by 03/22/15   Status Achieved           PT Long Term Goals - 04/30/15 0811    PT LONG TERM GOAL #1   Title B Hip MMT 4+/5 or better grossly to allow good hip stability and proper knee mechanics with LE exercises by 05/17/15   PT LONG TERM GOAL #2   Title pt states she is able to participate in exercise without noting knee pain greater than 2/10   Status Achieved   PT LONG TERM GOAL #3   Title pt able to stand/walk for duration/distance not limited by knee pain    Status Achieved               Plan - 05/03/15 0848    Clinical Impression Statement Less standing exercises today due to pt already having a large leg work out this morning. Continued focus on ITB STM/strumming.    PT Next Visit Plan Continue with POC   Consulted and Agree with Plan of Care Patient        Problem List Patient Active Problem List  Diagnosis Date Noted  . Right hip pain 01/18/2015  . Bilateral knee pain 09/18/2014  . Internal bleeding hemorrhoids 06/20/2013  . Guaiac positive stools 02/26/2013  . Zinc deficiency 10/24/2012  . Vitamin A deficiency 08/25/2012  . Sinus bradycardia 08/15/2012  . Allergic rhinitis due to allergen 08/15/2012  . Vitamin D deficiency 05/12/2012  . History of anemia 05/10/2012  . History of fibromyalgia 05/10/2012  . Lymphedema 05/10/2012  . Malabsorption syndrome 05/10/2012  . Seborrheic dermatitis of scalp 05/10/2012  . 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 S/P endometrial ablation 05/10/2012  . History of abdominoplasty 05/10/2012  . DEFICIENCY OF OTHER VITAMINS 02/05/2010  . PREMATURE VENTRICULAR CONTRACTIONS 02/05/2010  . BRADYCARDIA 02/05/2010    Barbette Hair, PTA 05/03/2015, 8:50 AM  [facility] [place], [place], [address] Phone: [phone]   Fax:   [phone]

## 2015-05-06 ENCOUNTER — Ambulatory Visit: Payer: 59 | Admitting: Rehabilitation

## 2015-05-07 ENCOUNTER — Ambulatory Visit: Payer: 59 | Admitting: Physical Therapy

## 2015-05-09 ENCOUNTER — Ambulatory Visit: Payer: 59 | Admitting: Physical Therapy

## 2015-05-09 DIAGNOSIS — R29898 Other symptoms and signs involving the musculoskeletal system: Secondary | ICD-10-CM

## 2015-05-09 DIAGNOSIS — M25561 Pain in right knee: Secondary | ICD-10-CM | POA: Diagnosis not present

## 2015-05-09 DIAGNOSIS — M25562 Pain in left knee: Principal | ICD-10-CM

## 2015-05-09 DIAGNOSIS — R262 Difficulty in walking, not elsewhere classified: Secondary | ICD-10-CM

## 2015-05-09 NOTE — Therapy (Signed)
[facility] [address]  [place], [place], [address] Phone: [phone]   Fax:  [phone]  Physical Therapy Treatment  Patient Details  Name: Jennifer Harrell MRN: [id number] Date of Birth: Dec 17, 1965 Referring Provider:  Dene Gentry, MD  Encounter Date: 05/09/2015      PT End of Session - 05/09/15 0801    Visit Number 15   Number of Visits 19   Date for PT Re-Evaluation 05/17/15   PT Start Time 0800   PT Stop Time 4650   PT Time Calculation (min) 47 min      Past Medical History  Diagnosis Date  . Vitamin D deficiency   . Anemia   . History of breast surgery 1983    reduction  . Fibromyalgia     Past Surgical History  Procedure Laterality Date  . Breast reduction    . Bpdd weight loss surgery  2002  . Uterine ablation    . Abdominoplasty  2005  . Thigh lift      There were no vitals filed for this visit.  Visit Diagnosis:  Arthralgia of both knees  Weakness of both hips  Difficulty walking      Subjective Assessment - 05/09/15 0802    Subjective States running more with personal trainer and performing lunges with better tolerance lately.  Overall states seems as though is still progressing.   Currently in Pain? Yes   Pain Score 2    Pain Location Knee   Pain Orientation Right   Pain Score 0   Pain Location Knee   Pain Orientation Left;Anterior   Pain Descriptors / Indicators Pressure        TODAY'S TREATMENT TherEx - Rec Bike lvl 2, 5' Bridge 10x, SL Bridge 10x each Stretch B HS, ITB, prone knee flexion in mod [employer] with 10# db 15x Leg Press 55# 2x14 (difficult but no pain) Standing Hip Extension Double Green TB x15 each with single pole A Standing Hip ABD Double Green TB 15x each with single pole A Iso Lunge with B Shoulder ABD 3# 2x5 with each LE position (20x total) Speed Skater 15x each             PT Short Term Goals - 04/22/15 3546    PT  SHORT TERM GOAL #1   Title pt independent with initial HEP by 03/22/15   Status Achieved           PT Long Term Goals - 05/09/15 1028    PT LONG TERM GOAL #1   Title B Hip MMT 4+/5 or better grossly to allow good hip stability and proper knee mechanics with LE exercises by 05/17/15   Status On-going   PT LONG TERM GOAL #2   Title pt states she is able to participate in exercise without noting knee pain greater than 2/10   Status Achieved   PT LONG TERM GOAL #3   Title pt able to stand/walk for duration/distance not limited by knee pain    Status Achieved               Plan - 05/09/15 1027    Clinical Impression Statement is progressing well with all aspects of treatment.  ITB and patella mobes good tolerance and near normal mobility/pliability.  Should be ready for d/c within 1-2 wks.   PT Next Visit Plan Continue with POC   Consulted and Agree with Plan of Care Patient  Problem List Patient Active Problem List   Diagnosis Date Noted  . Right hip pain 01/18/2015  . Bilateral knee pain 09/18/2014  . Internal bleeding hemorrhoids 06/20/2013  . Guaiac positive stools 02/26/2013  . Zinc deficiency 10/24/2012  . Vitamin A deficiency 08/25/2012  . Sinus bradycardia 08/15/2012  . Allergic rhinitis due to allergen 08/15/2012  . Vitamin D deficiency 05/12/2012  . History of anemia 05/10/2012  . History of fibromyalgia 05/10/2012  . Lymphedema 05/10/2012  . Malabsorption syndrome 05/10/2012  . Seborrheic dermatitis of scalp 05/10/2012  . 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 S/P endometrial ablation 05/10/2012  . History of abdominoplasty 05/10/2012  . DEFICIENCY OF OTHER VITAMINS 02/05/2010  . PREMATURE VENTRICULAR CONTRACTIONS 02/05/2010  . BRADYCARDIA 02/05/2010    Jackob Crookston PT, OCS 05/09/2015, 10:29 AM  [facility] [place], [place], [address] Phone: [phone]   Fax:   [phone]

## 2015-05-13 ENCOUNTER — Ambulatory Visit: Payer: 59 | Admitting: Rehabilitation

## 2015-05-13 DIAGNOSIS — M25561 Pain in right knee: Secondary | ICD-10-CM

## 2015-05-13 DIAGNOSIS — R262 Difficulty in walking, not elsewhere classified: Secondary | ICD-10-CM

## 2015-05-13 DIAGNOSIS — R29898 Other symptoms and signs involving the musculoskeletal system: Secondary | ICD-10-CM

## 2015-05-13 DIAGNOSIS — M25562 Pain in left knee: Principal | ICD-10-CM

## 2015-05-13 NOTE — Therapy (Signed)
[facility] [address]  [place], [place], [address] Phone: [phone]   Fax:  [phone]  Physical Therapy Treatment  Patient Details  Name: Jennifer Harrell MRN: [id number] Date of Birth: 02-08-1966 Referring Provider:  Dene Gentry, MD  Encounter Date: 05/13/2015      PT End of Session - 05/13/15 1308    Visit Number 16   Number of Visits 19   Date for PT Re-Evaluation 05/17/15   PT Start Time 1310   PT Stop Time 1350   PT Time Calculation (min) 40 min   Activity Tolerance Patient tolerated treatment well   Behavior During Therapy [facility] for tasks assessed/performed      Past Medical History  Diagnosis Date  . Vitamin D deficiency   . Anemia   . History of breast surgery 1983    reduction  . Fibromyalgia     Past Surgical History  Procedure Laterality Date  . Breast reduction    . Bpdd weight loss surgery  2002  . Uterine ablation    . Abdominoplasty  2005  . Thigh lift      There were no vitals filed for this visit.  Visit Diagnosis:  Arthralgia of both knees  Weakness of both hips  Difficulty walking      Subjective Assessment - 05/13/15 1314    Subjective Reports feeling pretty good today, no pain. Also reports she is off of work this week but going to the gym/trainer as before.    Currently in Pain? No/denies      TODAY'S TREATMENT TherEx - Rec Bike lvl 2, 5' Stretch B HS, ITB, prone knee flexion in mod thomas SL Bridge 10x each Prone SLR 12x each Free Squat with 10# db 15x Speed Skater 10x 3 way lunges (front, side, back) 2x5 each  Sidestepping with Blue TB at forefoot 59ftx2 each way Standing Hip ABD Double Green TB 15x each with single pole A Standing Hip EXT  Double Green TB 15x each with single pole A BOSU (down) Step up/SLS 5" hold x10 each with single pole A       PT Short Term Goals - 04/22/15 2500    PT SHORT TERM GOAL #1   Title pt independent with  initial HEP by 03/22/15   Status Achieved           PT Long Term Goals - 05/09/15 1028    PT LONG TERM GOAL #1   Title B Hip MMT 4+/5 or better grossly to allow good hip stability and proper knee mechanics with LE exercises by 05/17/15   Status On-going   PT LONG TERM GOAL #2   Title pt states she is able to participate in exercise without noting knee pain greater than 2/10   Status Achieved   PT LONG TERM GOAL #3   Title pt able to stand/walk for duration/distance not limited by knee pain    Status Achieved               Plan - 05/13/15 1348    Clinical Impression Statement Main focus on hip extension and abduction movements today. Will measure MMT at next visit.    PT Next Visit Plan Continue with POC. Measure strength.    Consulted and Agree with Plan of Care Patient        Problem List Patient Active Problem List   Diagnosis Date Noted  . Right hip pain 01/18/2015  . Bilateral  knee pain 09/18/2014  . Internal bleeding hemorrhoids 06/20/2013  . Guaiac positive stools 02/26/2013  . Zinc deficiency 10/24/2012  . Vitamin A deficiency 08/25/2012  . Sinus bradycardia 08/15/2012  . Allergic rhinitis due to allergen 08/15/2012  . Vitamin D deficiency 05/12/2012  . History of anemia 05/10/2012  . History of fibromyalgia 05/10/2012  . Lymphedema 05/10/2012  . Malabsorption syndrome 05/10/2012  . Seborrheic dermatitis of scalp 05/10/2012  . 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 S/P endometrial ablation 05/10/2012  . History of abdominoplasty 05/10/2012  . DEFICIENCY OF OTHER VITAMINS 02/05/2010  . PREMATURE VENTRICULAR CONTRACTIONS 02/05/2010  . BRADYCARDIA 02/05/2010    Barbette Hair, PTA 05/13/2015, 1:49 PM  [facility] [address]  [place], [place], [address] Phone: [phone]   Fax:  [phone]

## 2015-05-17 ENCOUNTER — Ambulatory Visit: Payer: 59 | Admitting: Rehabilitation

## 2015-05-17 DIAGNOSIS — M25561 Pain in right knee: Secondary | ICD-10-CM | POA: Diagnosis not present

## 2015-05-17 DIAGNOSIS — R262 Difficulty in walking, not elsewhere classified: Secondary | ICD-10-CM

## 2015-05-17 DIAGNOSIS — R29898 Other symptoms and signs involving the musculoskeletal system: Secondary | ICD-10-CM

## 2015-05-17 DIAGNOSIS — M25562 Pain in left knee: Principal | ICD-10-CM

## 2015-05-17 NOTE — Therapy (Signed)
[facility] [address]  [place], [place], [address] Phone: [phone]   Fax:  [phone]  Physical Therapy Treatment  Patient Details  Name: Jennifer Harrell MRN: [id number] Date of Birth: Sep 18, 1966 Referring Provider:  Dene Gentry, MD  Encounter Date: 05/17/2015      PT End of Session - 05/17/15 0803    Visit Number 17   Number of Visits 19   Date for PT Re-Evaluation 05/17/15   PT Start Time 0758   PT Stop Time 0840   PT Time Calculation (min) 42 min   Activity Tolerance Patient tolerated treatment well   Behavior During Therapy [facility] for tasks assessed/performed      Past Medical History  Diagnosis Date  . Vitamin D deficiency   . Anemia   . History of breast surgery 1983    reduction  . Fibromyalgia     Past Surgical History  Procedure Laterality Date  . Breast reduction    . Bpdd weight loss surgery  2002  . Uterine ablation    . Abdominoplasty  2005  . Thigh lift      There were no vitals filed for this visit.  Visit Diagnosis:  Arthralgia of both knees  Weakness of both hips  Difficulty walking      Subjective Assessment - 05/17/15 0805    Subjective Notes just some tightness, no pain.    Currently in Pain? No/denies            [facility] PT Assessment - 05/17/15 0803    Strength   Strength Assessment Site Hip   Right/Left Hip Left;Right   Right Hip Flexion 5/5   Right Hip Extension 4+/5   Right Hip External Rotation  --  5/5   Right Hip Internal Rotation  --  5/5   Right Hip ABduction --  5-/5   Left Hip Flexion 5/5   Left Hip Extension 4+/5   Left Hip External Rotation  --  5/5   Left Hip Internal Rotation  --  5/5   Left Hip ABduction --  5-/5         TODAY'S TREATMENT TherEx - Rec Bike lvl 2, 5' MMT Stretch Bil HS, ITB, prone knee flexion  SL Bridge 12x each Fitter 1 Black 1 Blue BW Lunge 12x 2 pole assist Fitter 1 Black 1 Blue Side Lunge 12x 2  pole assist Standing Hip Ext Double Green TB 12x each with single pole assist Free Squat with 10# db 20x  Manual- Bil ITB Strumming and medial/inferior patellar mobs       PT Short Term Goals - 04/22/15 0819    PT SHORT TERM GOAL #1   Title pt independent with initial HEP by 03/22/15   Status Achieved           PT Long Term Goals - 05/09/15 1028    PT LONG TERM GOAL #1   Title B Hip MMT 4+/5 or better grossly to allow good hip stability and proper knee mechanics with LE exercises by 05/17/15   Status On-going   PT LONG TERM GOAL #2   Title pt states she is able to participate in exercise without noting knee pain greater than 2/10   Status Achieved   PT LONG TERM GOAL #3   Title pt able to stand/walk for duration/distance not limited by knee pain    Status Achieved  Plan - 05/17/15 0842    Clinical Impression Statement Still some tenderness noted with Lt ITB but improvements noted with hip extension and abduction MMT.    PT Next Visit Plan Continue with POC.   Consulted and Agree with Plan of Care Patient        Problem List Patient Active Problem List   Diagnosis Date Noted  . Right hip pain 01/18/2015  . Bilateral knee pain 09/18/2014  . Internal bleeding hemorrhoids 06/20/2013  . Guaiac positive stools 02/26/2013  . Zinc deficiency 10/24/2012  . Vitamin A deficiency 08/25/2012  . Sinus bradycardia 08/15/2012  . Allergic rhinitis due to allergen 08/15/2012  . Vitamin D deficiency 05/12/2012  . History of anemia 05/10/2012  . History of fibromyalgia 05/10/2012  . Lymphedema 05/10/2012  . Malabsorption syndrome 05/10/2012  . Seborrheic dermatitis of scalp 05/10/2012  . 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 S/P endometrial ablation 05/10/2012  . History of abdominoplasty 05/10/2012  . DEFICIENCY OF OTHER VITAMINS 02/05/2010  . PREMATURE VENTRICULAR CONTRACTIONS 02/05/2010  . BRADYCARDIA 02/05/2010    Barbette Hair, PTA 05/17/2015,  8:43 AM  [facility] [place], [place], [address] Phone: [phone]   Fax:  [phone]

## 2015-05-20 ENCOUNTER — Ambulatory Visit: Payer: 59 | Admitting: Rehabilitation

## 2015-05-20 DIAGNOSIS — M25561 Pain in right knee: Secondary | ICD-10-CM | POA: Diagnosis not present

## 2015-05-20 DIAGNOSIS — R29898 Other symptoms and signs involving the musculoskeletal system: Secondary | ICD-10-CM

## 2015-05-20 DIAGNOSIS — M25562 Pain in left knee: Principal | ICD-10-CM

## 2015-05-20 DIAGNOSIS — R262 Difficulty in walking, not elsewhere classified: Secondary | ICD-10-CM

## 2015-05-20 NOTE — Therapy (Signed)
[facility] [address]  [place], [place], [address] Phone: [phone]   Fax:  [phone]  Physical Therapy Treatment  Patient Details  Name: Jennifer Harrell MRN: [id number] Date of Birth: 03-03-1966 Referring Provider:  Dene Gentry, MD  Encounter Date: 05/20/2015      PT End of Session - 05/20/15 0757    Visit Number 18   Number of Visits 19   Date for PT Re-Evaluation 05/17/15   PT Start Time 0757   PT Stop Time 0840   PT Time Calculation (min) 43 min      Past Medical History  Diagnosis Date  . Vitamin D deficiency   . Anemia   . History of breast surgery 1983    reduction  . Fibromyalgia     Past Surgical History  Procedure Laterality Date  . Breast reduction    . Bpdd weight loss surgery  2002  . Uterine ablation    . Abdominoplasty  2005  . Thigh lift      There were no vitals filed for this visit.  Visit Diagnosis:  Arthralgia of both knees  Weakness of both hips  Difficulty walking      Subjective Assessment - 05/20/15 0800    Subjective Still no pain, just stiffness. No pain over the weekend.    Currently in Pain? No/denies         TODAY'S TREATMENT TherEx - Rec Bike lvl 2, 5' Stretch B HS, ITB, prone knee flexion Prone SLR 15x each   Seated Inferior Mobs with education   Standing Hip Extension Double Green TB x15 each with single pole A Standing Hip Abduction Double Green TB 15x each with single pole A BOSU (down) DL Squat 10x 2 pole assist BOSU (up) Alt Lunges 10x 2 pole assist  Speed Skater 15x each   Manual- Bil ITB Strumming and medial/inferior patellar mobs  TherEx- Bil ITB Foam Rolling on 6" Foam Roll 5x each         PT Short Term Goals - 04/22/15 6294    PT SHORT TERM GOAL #1   Title pt independent with initial HEP by 03/22/15   Status Achieved           PT Long Term Goals - 05/09/15 1028    PT LONG TERM GOAL #1   Title B Hip MMT 4+/5  or better grossly to allow good hip stability and proper knee mechanics with LE exercises by 05/17/15   Status On-going   PT LONG TERM GOAL #2   Title pt states she is able to participate in exercise without noting knee pain greater than 2/10   Status Achieved   PT LONG TERM GOAL #3   Title pt able to stand/walk for duration/distance not limited by knee pain    Status Achieved               Plan - 05/20/15 0841    Clinical Impression Statement Good tolerance to all exercises. Began ITB rolling today with good response and reviewed at home inferior patellar glides.    PT Next Visit Plan Continue with POC, renew vs d/c        Problem List Patient Active Problem List   Diagnosis Date Noted  . Right hip pain 01/18/2015  . Bilateral knee pain 09/18/2014  . Internal bleeding hemorrhoids 06/20/2013  . Guaiac positive stools 02/26/2013  . Zinc deficiency 10/24/2012  . Vitamin A deficiency 08/25/2012  .  Sinus bradycardia 08/15/2012  . Allergic rhinitis due to allergen 08/15/2012  . Vitamin D deficiency 05/12/2012  . History of anemia 05/10/2012  . History of fibromyalgia 05/10/2012  . Lymphedema 05/10/2012  . Malabsorption syndrome 05/10/2012  . Seborrheic dermatitis of scalp 05/10/2012  . 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 S/P endometrial ablation 05/10/2012  . History of abdominoplasty 05/10/2012  . DEFICIENCY OF OTHER VITAMINS 02/05/2010  . PREMATURE VENTRICULAR CONTRACTIONS 02/05/2010  . BRADYCARDIA 02/05/2010    Barbette Hair, PTA 05/20/2015, 8:43 AM  [facility] [place], [place], [address] Phone: [phone]   Fax:  [phone]

## 2015-05-23 ENCOUNTER — Ambulatory Visit: Payer: 59 | Admitting: Physical Therapy

## 2015-05-23 DIAGNOSIS — M25561 Pain in right knee: Secondary | ICD-10-CM | POA: Diagnosis not present

## 2015-05-23 DIAGNOSIS — M25562 Pain in left knee: Principal | ICD-10-CM

## 2015-05-23 NOTE — Therapy (Addendum)
[facility] [address]  [place], [place], [address] Phone: [phone]   Fax:  [phone]  Physical Therapy Treatment  Patient Details  Name: Jennifer Harrell MRN: [id number] Date of Birth: Jun 27, 1966 Referring Provider:  Dene Gentry, MD  Encounter Date: 05/23/2015      PT End of Session - 05/23/15 0751    Visit Number 19   Number of Visits 19   PT Start Time 0750   PT Stop Time 0833   PT Time Calculation (min) 43 min      Past Medical History  Diagnosis Date  . Vitamin D deficiency   . Anemia   . History of breast surgery 1983    reduction  . Fibromyalgia     Past Surgical History  Procedure Laterality Date  . Breast reduction    . Bpdd weight loss surgery  2002  . Uterine ablation    . Abdominoplasty  2005  . Thigh lift      There were no vitals filed for this visit.  Visit Diagnosis:  Arthralgia of both knees      Subjective Assessment - 05/23/15 0751    Subjective Did a lot of lunges and squats during yesterday's personal training workout and is stiff/tight today (L>R).   Currently in Pain? No/denies            [facility] PT Assessment - 05/23/15 0001    Observation/Other Assessments   Focus on Therapeutic Outcomes (FOTO)  21% limitation   Strength   Right Hip Flexion 4+/5   Right Hip Extension 4+/5   Right Hip External Rotation  --  4+/5   Right Hip Internal Rotation  --  5/5   Right Hip ABduction 5/5   Right Hip ADduction 5/5   Left Hip Flexion 4+/5   Left Hip Extension 4+/5   Left Hip External Rotation  --  5/5   Left Hip Internal Rotation  --  5/5   Left Hip ABduction 4+/5   Left Hip ADduction 5/5   Right Knee Flexion 5/5   Right Knee Extension 5/5   Left Knee Flexion 5/5   Left Knee Extension 5/5   Patellofemoral Grind test (Clark's Sign)   Findings Postive   Side  Right;Left        TODAY'S TREATMENT TherEx - Elliptical lvl 2.0 3' Stretch B HS, ITB,  prone knee flexion B LE MMT assessment SL Bridge 10x each Free Squat 15x Free lunges 12x each - no pain, mild pulling/stretch to anterior knees Lateral lunges 12x each Side Step-ups 8" 15x each Stepovers 6" 12x each Wall Sit with Black TB at knees 3x20" (added to HEP)                       PT Short Term Goals - 04/22/15 0819    PT SHORT TERM GOAL #1   Title pt independent with initial HEP by 03/22/15   Status Achieved           PT Long Term Goals - 05/23/15 0815    PT LONG TERM GOAL #1   Title B Hip MMT 4+/5 or better grossly to allow good hip stability and proper knee mechanics with LE exercises by 05/17/15   Status Achieved   PT LONG TERM GOAL #2   Title pt states she is able to participate in exercise without noting knee pain greater than 2/10   Status Achieved  PT LONG TERM GOAL #3   Title pt able to stand/walk for duration/distance not limited by knee pain    Status Achieved               Plan - 05/23/15 0835    Clinical Impression Statement FOTO improved from 42% limitation at start of care to 21% limitation currently.  Pt displays good mechanics with squats and lunges, B hip and knee MMT 4+/5 to 5/5 grossly without c/o pain with testing.  Pt is able to participate in regular exercise without limitation by knee pain.  She is being discharged from our care at this time due to all goals met and independent with exercise.   PT Next Visit Plan discharged today   Consulted and Agree with Plan of Care Patient        Problem List Patient Active Problem List   Diagnosis Date Noted  . Right hip pain 01/18/2015  . Bilateral knee pain 09/18/2014  . Internal bleeding hemorrhoids 06/20/2013  . Guaiac positive stools 02/26/2013  . Zinc deficiency 10/24/2012  . Vitamin A deficiency 08/25/2012  . Sinus bradycardia 08/15/2012  . Allergic rhinitis due to allergen 08/15/2012  . Vitamin D deficiency 05/12/2012  . History of anemia 05/10/2012  .  History of fibromyalgia 05/10/2012  . Lymphedema 05/10/2012  . Malabsorption syndrome 05/10/2012  . Seborrheic dermatitis of scalp 05/10/2012  . 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 S/P endometrial ablation 05/10/2012  . History of abdominoplasty 05/10/2012  . DEFICIENCY OF OTHER VITAMINS 02/05/2010  . PREMATURE VENTRICULAR CONTRACTIONS 02/05/2010  . BRADYCARDIA 02/05/2010    Saylor Murry PT, OCS 05/23/2015, 8:42 AM  [facility] [place], [place], [address] Phone: [phone]   Fax:  [phone]     PHYSICAL THERAPY DISCHARGE SUMMARY  Visits from Start of Care: 19  Current functional level related to goals / functional outcomes: See above; all goals met   Remaining deficits: n/a   Education / Equipment: HEP  Plan: Patient agrees to discharge.  Patient goals were met. Patient is being discharged due to meeting the stated rehab goals.  ?????    Laureen Abrahams, PT, DPT 06/13/2015 9:25 AM  [facility] [place], [place] [address]  [phone] (office) [phone] (fax)

## 2015-07-03 NOTE — Telephone Encounter (Signed)
finished

## 2015-10-10 ENCOUNTER — Encounter: Payer: Self-pay | Admitting: Hematology

## 2015-10-10 ENCOUNTER — Telehealth: Payer: Self-pay | Admitting: Hematology

## 2015-10-10 ENCOUNTER — Telehealth: Payer: Self-pay | Admitting: *Deleted

## 2015-10-10 ENCOUNTER — Ambulatory Visit (HOSPITAL_BASED_OUTPATIENT_CLINIC_OR_DEPARTMENT_OTHER): Payer: 59

## 2015-10-10 ENCOUNTER — Ambulatory Visit (HOSPITAL_BASED_OUTPATIENT_CLINIC_OR_DEPARTMENT_OTHER): Payer: 59 | Admitting: Hematology

## 2015-10-10 VITALS — BP 124/64 | HR 57 | Temp 98.5°F | Resp 18 | Ht 68.0 in | Wt 202.0 lb

## 2015-10-10 DIAGNOSIS — D509 Iron deficiency anemia, unspecified: Secondary | ICD-10-CM

## 2015-10-10 LAB — COMPREHENSIVE METABOLIC PANEL (CC13)
ALT: 40 U/L (ref 0–55)
ANION GAP: 8 meq/L (ref 3–11)
AST: 43 U/L — ABNORMAL HIGH (ref 5–34)
Albumin: 4 g/dL (ref 3.5–5.0)
Alkaline Phosphatase: 125 U/L (ref 40–150)
BILIRUBIN TOTAL: 1.37 mg/dL — AB (ref 0.20–1.20)
BUN: 13.3 mg/dL (ref 7.0–26.0)
CHLORIDE: 111 meq/L — AB (ref 98–109)
CO2: 25 meq/L (ref 22–29)
Calcium: 8.9 mg/dL (ref 8.4–10.4)
Creatinine: 0.7 mg/dL (ref 0.6–1.1)
Glucose: 86 mg/dl (ref 70–140)
Potassium: 4.1 mEq/L (ref 3.5–5.1)
Sodium: 145 mEq/L (ref 136–145)
Total Protein: 6.7 g/dL (ref 6.4–8.3)

## 2015-10-10 LAB — FERRITIN CHCC: Ferritin: 9 ng/ml (ref 9–269)

## 2015-10-10 LAB — IRON AND TIBC CHCC
%SAT: 21 % (ref 21–57)
IRON: 85 ug/dL (ref 41–142)
TIBC: 403 ug/dL (ref 236–444)
UIBC: 318 ug/dL (ref 120–384)

## 2015-10-10 LAB — CBC & DIFF AND RETIC
BASO%: 1 % (ref 0.0–2.0)
Basophils Absolute: 0.1 10*3/uL (ref 0.0–0.1)
EOS%: 1.4 % (ref 0.0–7.0)
Eosinophils Absolute: 0.1 10*3/uL (ref 0.0–0.5)
HCT: 40.2 % (ref 34.8–46.6)
HGB: 12.7 g/dL (ref 11.6–15.9)
Immature Retic Fract: 2 % (ref 1.60–10.00)
LYMPH%: 33.9 % (ref 14.0–49.7)
MCH: 30.3 pg (ref 25.1–34.0)
MCHC: 31.6 g/dL (ref 31.5–36.0)
MCV: 95.9 fL (ref 79.5–101.0)
MONO#: 0.6 10*3/uL (ref 0.1–0.9)
MONO%: 11.5 % (ref 0.0–14.0)
NEUT#: 2.7 10*3/uL (ref 1.5–6.5)
NEUT%: 52.2 % (ref 38.4–76.8)
PLATELETS: 159 10*3/uL (ref 145–400)
RBC: 4.19 10*6/uL (ref 3.70–5.45)
RDW: 13.1 % (ref 11.2–14.5)
Retic %: 0.71 % (ref 0.70–2.10)
Retic Ct Abs: 29.75 10*3/uL — ABNORMAL LOW (ref 33.70–90.70)
WBC: 5.1 10*3/uL (ref 3.9–10.3)
lymph#: 1.7 10*3/uL (ref 0.9–3.3)

## 2015-10-10 NOTE — Telephone Encounter (Signed)
Per staff message and POF I have scheduled appts. Advised scheduler of appts. JMW  

## 2015-10-10 NOTE — Progress Notes (Signed)
Marland Kitchen    HEMATOLOGY/ONCOLOGY CONSULTATION NOTE  Date of Service: 10/10/2015  Patient Care Team: Lanice Shirts, MD as PCP - General (Internal Medicine)  CHIEF COMPLAINTS/PURPOSE OF CONSULTATION:  Management of iron deficiency anemia   HISTORY OF PRESENTING ILLNESS:  Jennifer Harrell is a wonderful 49 y.o. female who has been referred to [place] by Dr Kelton Pillar, MD And Dr Chalmers Cater for evaluation and management of iron deficiency anemia.  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 Patient has been following with Dr. Chalmers Cater her endocrinologist for ongoing management of her vitamin D and other deficiencies which occur with much greater frequency with her type of bariatric surgery compared to the standard Roux-en-Y gastric bypass. She has been referred for evaluation and management of iron deficiency anemia. Her last ferritin levels done on 09/16/2015 were 9. Patient notes significant fatigue and uncontrollable pica symptoms characterized by wanting to eat a lot of ice. 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 Patient notes that she is always had poor oral absorption of iron and required IV iron about 5-6 years ago. She notes that she doesn't really absorb oral iron which is understandable given her type of weight loss surgery. Has been on other multivitamins and aggressive vitamin D replacement. Notes that her nails have become brittle and break easily.  Notes no issues with overt GI bleeding or other blood loss. No recent surgical procedures.    MEDICAL HISTORY:  Past Medical History  Diagnosis Date  . Vitamin D deficiency   .  Anemia   . History of breast surgery 1983    reduction  . Fibromyalgia    Recurrent UTI Iron deficiency and poor Iron absorption.  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 Breast reduction    . Bpdd weight loss surgery  2002  . Uterine ablation    . Abdominoplasty  2005  . Thigh lift      SOCIAL HISTORY: Social History   Social History  . Marital Status: Married    Spouse Name: N/A  . Number of Children: N/A  . Years of Education: N/A   Occupational History  . Secondary school teacher    Social History Main Topics  . Smoking status: Never Smoker   . Smokeless tobacco: Never Used  . Alcohol Use: No  . Drug Use: No  . Sexual Activity:    Partners: Male   Other Topics Concern  . Not on file   Social History Narrative    FAMILY HISTORY: Family History  Problem Relation Age of Onset  . Cancer Maternal Grandfather     stomach  . Diabetes Mother   . Diabetes Maternal Grandfather   . Heart failure Maternal Grandfather   . Heart failure Maternal Grandmother     ALLERGIES:  is allergic to prednisone and sulfonamide derivatives.  MEDICATIONS:  Current Outpatient Prescriptions  Medication Sig Dispense Refill  . albuterol (PROVENTIL HFA;VENTOLIN HFA) 108 (90 BASE) MCG/ACT inhaler Inhale 2 puffs into the lungs every 6 (six) hours as needed for wheezing  or shortness of breath. (Patient not taking: Reported on 03/13/2015) 1 Inhaler 0  . Multiple Vitamin (MULTIVITAMIN) tablet Take 1 tablet by mouth daily.    . Vitamin D, Ergocalciferol, (DRISDOL) 50000 UNITS CAPS capsule      No current facility-administered medications for this visit.    REVIEW OF SYSTEMS:    10 Point review of Systems was done is negative except as noted above.  PHYSICAL EXAMINATION: ECOG PERFORMANCE STATUS: 1 -  Symptomatic but completely ambulatory  . Filed Vitals:   10/10/15 1057  BP: 124/64  Pulse: 57  Temp: 98.5 F (36.9 C)  Resp: 18   Filed Weights   10/10/15 1057  Weight: 202 lb (91.627 kg)   .Body mass index is 30.72 kg/(m^2).  GENERAL:alert, in no acute distress and comfortable SKIN: skin color, texture, turgor are normal, no rashes or significant lesions EYES: normal, conjunctiva are pink and non-injected, sclera clear OROPHARYNX:no exudate, no erythema and lips, buccal mucosa, and tongue normal  NECK: supple, no JVD, thyroid normal size, non-tender, without nodularity LYMPH:  no palpable lymphadenopathy in the cervical, axillary or inguinal LUNGS: clear to auscultation with normal respiratory effort HEART: regular rate & rhythm,  no murmurs and no lower extremity edema ABDOMEN: abdomen soft, non-tender, normoactive bowel sounds  Musculoskeletal: no cyanosis of digits and no clubbing  PSYCH: alert & oriented x 3 with fluent speech NEURO: no focal motor/sensory deficits  LABORATORY DATA:  I have reviewed the data as listed  . CBC Latest Ref Rng 10/10/2015 05/10/2012 06/21/2009  WBC 3.9 - 10.3 10e3/uL 5.1 5.8 -  Hemoglobin 11.6 - 15.9 g/dL 12.7 12.7 13.8  Hematocrit 34.8 - 46.6 % 40.2 39.0 -  Platelets 145 - 400 10e3/uL 159 213 -    . CMP Latest Ref Rng 10/10/2015 05/10/2012 06/22/2008  Glucose 70 - 140 mg/dl 86 76 65(L)  BUN 7.0 - 26.0 mg/dL 13.3 9 9   Creatinine 0.6 - 1.1 mg/dL 0.7 0.54 0.67  Sodium 136 - 145 mEq/L 145 143 142  Potassium 3.5 - 5.1 mEq/L 4.1 4.0 4.0  Chloride 96 - 112 mEq/L - 109 109  CO2 22 - 29 mEq/L 25 24 24   Calcium 8.4 - 10.4 mg/dL 8.9 8.3(L) 8.4  Total Protein 6.4 - 8.3 g/dL 6.7 6.4 6.4  Total Bilirubin 0.20 - 1.20 mg/dL 1.37(H) 1.2 0.8  Alkaline Phos 40 - 150 U/L 125 86 61  AST 5 - 34 U/L 43(H) 22 17  ALT 0 - 55 U/L 40 20 13   . Lab Results  Component Value Date   IRON 85 10/10/2015   TIBC 403 10/10/2015   IRONPCTSAT 21 10/10/2015    (Iron and TIBC)  Lab Results  Component Value Date   FERRITIN 9 10/10/2015   B12 757 RBC folate 381   RADIOGRAPHIC STUDIES: I have personally reviewed the radiological images as listed and agreed with the findings in the report. No results found.  ASSESSMENT & PLAN:   49 year old very pleasant African-American female with  #1 severe iron deficiency with mild anemia. Patient is fairly symptomatic from her iron deficiency with pica symptoms, significant fatigue and brittle nails. 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 Patient has previously required IV iron about 5-6  years ago. She is uncertain what IV iron preparation she received but feels that it was likely iron dextran. She had no issues tolerating it at that time. Plan -Patient has significant iron deficiency which needs aggressive IV iron replacement given that she does not absorb iron orally. -We'll plan to treat with IV Feraheme 510 mg every weekly 3 doses. -We discussed the pros and cons of IV iron and the potential adverse effects. Informed consent obtained from the patient. -Patient was recommended to take cetirizine and famotidine prior to arrival for her IV iron to reduce the risk of allergic reactions. -Her vitamin B12 and RBC folate levels appear appropriate. -Continue aggressive vitamin D replacement and other trace element replacements as per endocrinology/primary care physician. -All the patient's questions and concerns were answered in detail to her apparent satisfaction.  Return to care in 3 months with Dr. Irene Limbo with CBC with differential and reticulocyte's, CMP, ferritin, iron  profile  All of the patients questions were answered with apparent satisfaction. The patient knows to call the clinic with any problems, questions or concerns.  I spent 45 minutes counseling the patient face to face. The total time spent in the appointment was 60 minutes and more than 50% was on counseling and direct patient cares.    Sullivan Lone MD [place] AAHIVMS [facility] Hematology/Oncology Physician [facility]  (Office):       [phone] (Work cell):  [phone] (Fax):           [phone]  10/10/2015 11:15 AM

## 2015-10-10 NOTE — Telephone Encounter (Signed)
per pof to sch pt appt-sent MW email to sch fera-adv pt will call after reply

## 2015-10-11 ENCOUNTER — Telehealth: Payer: Self-pay | Admitting: Hematology

## 2015-10-11 LAB — FOLATE RBC: RBC FOLATE: 381 ng/mL (ref >280)

## 2015-10-11 LAB — VITAMIN B12: VITAMIN B 12: 757 pg/mL (ref 211–911)

## 2015-10-11 NOTE — Telephone Encounter (Signed)
Lvm advising appt 11/14 @ 1pm and asked pt to pick up appt calendar then.

## 2015-10-14 ENCOUNTER — Ambulatory Visit (HOSPITAL_BASED_OUTPATIENT_CLINIC_OR_DEPARTMENT_OTHER): Payer: 59

## 2015-10-14 ENCOUNTER — Telehealth: Payer: Self-pay | Admitting: Hematology

## 2015-10-14 VITALS — BP 119/74 | HR 58 | Temp 98.7°F | Resp 18

## 2015-10-14 DIAGNOSIS — D509 Iron deficiency anemia, unspecified: Secondary | ICD-10-CM

## 2015-10-14 MED ORDER — FERUMOXYTOL INJECTION 510 MG/17 ML
510.0000 mg | Freq: Once | INTRAVENOUS | Status: AC
  Administered 2015-10-14: 510 mg via INTRAVENOUS
  Filled 2015-10-14: qty 17

## 2015-10-14 MED ORDER — SODIUM CHLORIDE 0.9 % IV SOLN
Freq: Once | INTRAVENOUS | Status: AC
  Administered 2015-10-14: 13:00:00 via INTRAVENOUS

## 2015-10-14 NOTE — Progress Notes (Signed)
Spoke to Dr. Irene Limbo regarding pt labs and ok to continue first time Feraheme treatment today as planned.

## 2015-10-14 NOTE — Patient Instructions (Signed)

## 2015-10-14 NOTE — Telephone Encounter (Signed)
per reply from MW-cld pt and gave fera appts times & dates-left message

## 2015-10-21 ENCOUNTER — Ambulatory Visit (HOSPITAL_BASED_OUTPATIENT_CLINIC_OR_DEPARTMENT_OTHER): Payer: 59

## 2015-10-21 VITALS — BP 134/80 | HR 49 | Temp 98.9°F | Resp 18

## 2015-10-21 DIAGNOSIS — D509 Iron deficiency anemia, unspecified: Secondary | ICD-10-CM

## 2015-10-21 MED ORDER — FERUMOXYTOL INJECTION 510 MG/17 ML
510.0000 mg | Freq: Once | INTRAVENOUS | Status: AC
  Administered 2015-10-21: 510 mg via INTRAVENOUS
  Filled 2015-10-21: qty 17

## 2015-10-21 MED ORDER — SODIUM CHLORIDE 0.9 % IV SOLN
Freq: Once | INTRAVENOUS | Status: AC
  Administered 2015-10-21: 14:00:00 via INTRAVENOUS

## 2015-10-21 NOTE — Patient Instructions (Signed)

## 2015-10-28 ENCOUNTER — Ambulatory Visit (HOSPITAL_BASED_OUTPATIENT_CLINIC_OR_DEPARTMENT_OTHER): Payer: 59

## 2015-10-28 VITALS — BP 100/72 | HR 49 | Temp 99.2°F

## 2015-10-28 DIAGNOSIS — D509 Iron deficiency anemia, unspecified: Secondary | ICD-10-CM | POA: Diagnosis not present

## 2015-10-28 MED ORDER — SODIUM CHLORIDE 0.9 % IV SOLN
Freq: Once | INTRAVENOUS | Status: AC
  Administered 2015-10-28: 14:00:00 via INTRAVENOUS

## 2015-10-28 MED ORDER — SODIUM CHLORIDE 0.9 % IV SOLN
510.0000 mg | Freq: Once | INTRAVENOUS | Status: DC
  Filled 2015-10-28: qty 17

## 2015-11-06 ENCOUNTER — Telehealth: Payer: Self-pay | Admitting: *Deleted

## 2015-11-06 NOTE — Telephone Encounter (Signed)
VM message from patient stating she has recently received 3 iron infusions and today when working out (specifically lifting weights) she noticed bruises 'popping up' on her arms.  Pt wanted to know how concerned she should be with this and if she should alter her work out regime.

## 2015-11-07 NOTE — Telephone Encounter (Signed)
Spoke with patient this am (11/07/15). Pt states she has noticed some bruising on her arms after working out and after massage. Noted platelets to be @ 159k 4 weeks ago. Advised patient to ease up on the weights, avoid deep tissue massage. Pt states she sees her PCP next week-advised her to have cbc re-checked. If pt continues to see bruising without obvious/known injury to call us back. Pt voiced understanding.

## 2015-11-15 ENCOUNTER — Telehealth: Payer: Self-pay

## 2015-11-15 NOTE — Telephone Encounter (Signed)
Pt calling asking if we received outside labs and what the highs and lows mean.

## 2015-11-18 ENCOUNTER — Telehealth: Payer: Self-pay | Admitting: *Deleted

## 2015-11-18 NOTE — Telephone Encounter (Signed)
Call from patient asking if "results from [facility] lab drawn on 11-12-2015.  Will notify Dr. Irene Limbo.  Patient reports Ferritin = 487 but the WBC and RBC results are less than they were here in November.  Platelets are WNL.  I'm concerned and need explanation because I am bruising.  No bleeding."  This nurse found no results in Media.  Voicemail left for H.I.M. Requesting status of faxed results.

## 2015-11-19 NOTE — Telephone Encounter (Signed)
Labs received and patient informed.

## 2015-12-12 ENCOUNTER — Other Ambulatory Visit: Payer: Self-pay | Admitting: Internal Medicine

## 2015-12-12 DIAGNOSIS — R945 Abnormal results of liver function studies: Principal | ICD-10-CM

## 2015-12-12 DIAGNOSIS — R7989 Other specified abnormal findings of blood chemistry: Secondary | ICD-10-CM

## 2015-12-17 ENCOUNTER — Ambulatory Visit
Admission: RE | Admit: 2015-12-17 | Discharge: 2015-12-17 | Disposition: A | Discharge status: Home / Self Care | Payer: 59 | Source: Ambulatory Visit | Attending: Internal Medicine | Admitting: Internal Medicine

## 2015-12-17 DIAGNOSIS — R945 Abnormal results of liver function studies: Principal | ICD-10-CM

## 2015-12-17 DIAGNOSIS — R7989 Other specified abnormal findings of blood chemistry: Secondary | ICD-10-CM

## 2015-12-20 ENCOUNTER — Other Ambulatory Visit: Payer: 59

## 2016-01-10 ENCOUNTER — Ambulatory Visit: Payer: 59 | Admitting: Hematology

## 2016-01-13 ENCOUNTER — Encounter: Payer: Self-pay | Admitting: *Deleted

## 2016-01-13 NOTE — Progress Notes (Signed)
Pt missed MD visit scheduled 01/10/16.  Tried to contact the patient to follow up/try to reschedule apt.  Unable to reach patient, VM not set up.  Letter printed and sent to patient to consider follow up with Dr. Irene Limbo.

## 2016-02-20 ENCOUNTER — Telehealth: Payer: Self-pay | Admitting: Hematology

## 2016-02-20 NOTE — Telephone Encounter (Signed)
pt cld & left a voicemail/wanted to r/s appt-cld & spoke to pt * r/s appt time & date

## 2016-03-06 ENCOUNTER — Ambulatory Visit (HOSPITAL_BASED_OUTPATIENT_CLINIC_OR_DEPARTMENT_OTHER): Payer: 59 | Admitting: Hematology

## 2016-03-06 ENCOUNTER — Encounter: Payer: Self-pay | Admitting: Hematology

## 2016-03-06 VITALS — BP 120/80 | HR 64 | Temp 98.1°F | Resp 18 | Ht 68.0 in | Wt 216.3 lb

## 2016-03-06 DIAGNOSIS — D509 Iron deficiency anemia, unspecified: Secondary | ICD-10-CM | POA: Diagnosis not present

## 2016-04-20 NOTE — Progress Notes (Signed)
Marland Kitchen    HEMATOLOGY/ONCOLOGY CLINIC NOTE  Date of Service: 03/06/2016  Patient Care Team: Lanice Shirts, MD as PCP - General (Internal Medicine)  CHIEF COMPLAINTS: follow up for iron deficiency anemia   HISTORY OF PRESENTING ILLNESS:  Jennifer Harrell is a wonderful 50 y.o. female who has been referred to [place] by Dr Kelton Pillar, MD And Dr Chalmers Cater for evaluation and management of iron deficiency anemia.  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 Patient has been following with Dr. Chalmers Cater her endocrinologist for ongoing management of her vitamin D and other deficiencies which occur with much greater frequency with her type of bariatric surgery compared to the standard Roux-en-Y gastric bypass. She has been referred for evaluation and management of iron deficiency anemia. Her last ferritin levels done on 09/16/2015 were 9. Patient notes significant fatigue and uncontrollable pica symptoms characterized by wanting to eat a lot of ice. 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 Patient notes that she is always had poor oral absorption of iron and required IV iron about 5-6 years ago. She notes that she doesn't really absorb oral iron which is understandable given her type of weight loss surgery. Has been on other multivitamins and aggressive vitamin D replacement. Notes that her nails have become brittle and break easily.  Notes no issues with overt GI bleeding or other blood loss. No recent surgical procedures.   INTERVAL HISTORY  Jennifer Harrell is here for a scheduled follow up for Iron deficiency anemia.  She notes no significant  difference after receiving the IV feraheme. Labs were reviewed today with the patient reports that she would prefer to have these done with her primary care physician.   Of note she appears to have empirically received Tamiflu for possible flu in February 2017 at [facility]. No overt bleeding.   MEDICAL HISTORY:  Past Medical History  Diagnosis Date  . Vitamin D deficiency   . Anemia   . History of breast surgery 1983    reduction  . Fibromyalgia    Recurrent UTI Iron deficiency and poor Iron absorption.  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 Breast reduction    . Bpdd weight loss surgery  2002  . Uterine ablation    . Abdominoplasty  2005  . Thigh lift      SOCIAL HISTORY: Social History   Social History  . Marital Status: Married    Spouse Name: N/A  . Number of Children: N/A  . Years of Education: N/A   Occupational History  . Secondary school teacher    Social History Main Topics  . Smoking status: Never Smoker   . Smokeless tobacco: Never Used  . Alcohol Use: No  . Drug Use: No  . Sexual Activity:    Partners: Male   Other Topics Concern  . Not on file   Social History Narrative    FAMILY HISTORY: Family History  Problem Relation Age of Onset  . Cancer Maternal Grandfather     stomach  . Diabetes  Mother   . Diabetes Maternal Grandfather   . Heart failure Maternal Grandfather   . Heart failure Maternal Grandmother     ALLERGIES:  is allergic to ciprofloxacin; prednisone; and sulfonamide derivatives.  MEDICATIONS:  Current Outpatient Prescriptions  Medication Sig Dispense Refill  . Multiple Vitamin (MULTIVITAMIN) tablet Take 1 tablet by mouth daily.    . Vitamin D, Ergocalciferol, (DRISDOL) 50000 UNITS CAPS capsule      No current facility-administered medications for this  visit.    REVIEW OF SYSTEMS:    10 Point review of Systems was done is negative except as noted above.  PHYSICAL EXAMINATION: ECOG PERFORMANCE STATUS: 1 - Symptomatic but completely ambulatory  . Filed Vitals:   03/06/16 0849  BP: 120/80  Pulse: 64  Temp: 98.1 F (36.7 C)  Resp: 18   Filed Weights   03/06/16 0849  Weight: 216 lb 4.8 oz (98.113 kg)   .Body mass index is 32.9 kg/(m^2).  GENERAL:alert, in no acute distress and comfortable SKIN: skin color, texture, turgor are normal, no rashes or significant lesions EYES: normal, conjunctiva are pink and non-injected, sclera clear OROPHARYNX:no exudate, no erythema and lips, buccal mucosa, and tongue normal  NECK: supple, no JVD, thyroid normal size, non-tender, without nodularity LYMPH:  no palpable lymphadenopathy in the cervical, axillary or inguinal LUNGS: clear to auscultation with normal respiratory effort HEART: regular rate & rhythm,  no murmurs and no lower extremity edema ABDOMEN: abdomen soft, non-tender, normoactive bowel sounds  Musculoskeletal: no cyanosis of digits and no clubbing  PSYCH: alert & oriented x 3 with fluent speech NEURO: no focal motor/sensory deficits  LABORATORY DATA:  I have reviewed the data as listed  . CBC Latest Ref Rng 10/10/2015 05/10/2012 06/21/2009  WBC 3.9 - 10.3 10e3/uL 5.1 5.8 -  Hemoglobin 11.6 - 15.9 g/dL 12.7 12.7 13.8  Hematocrit 34.8 - 46.6 % 40.2 39.0 -  Platelets 145 - 400 10e3/uL 159 213 -    . CMP Latest Ref Rng 10/10/2015 05/10/2012 06/22/2008  Glucose 70 - 140 mg/dl 86 76 65(L)  BUN 7.0 - 26.0 mg/dL 13.3 9 9   Creatinine 0.6 - 1.1 mg/dL 0.7 0.54 0.67  Sodium 136 - 145 mEq/L 145 143 142  Potassium 3.5 - 5.1 mEq/L 4.1 4.0 4.0  Chloride 96 - 112 mEq/L - 109 109  CO2 22 - 29 mEq/L 25 24 24   Calcium 8.4 - 10.4 mg/dL 8.9 8.3(L) 8.4  Total Protein 6.4 - 8.3 g/dL 6.7 6.4 6.4  Total Bilirubin 0.20 - 1.20 mg/dL 1.37(H) 1.2 0.8  Alkaline Phos 40 - 150 U/L 125 86 61  AST  5 - 34 U/L 43(H) 22 17  ALT 0 - 55 U/L 40 20 13   RADIOGRAPHIC STUDIES: I have personally reviewed the radiological images as listed and agreed with the findings in the report. No results found.  ASSESSMENT & PLAN:   50 year old very pleasant African-American female with  #1 severe iron deficiency with mild anemia. Patient is fairly symptomatic from her iron deficiency with pica symptoms, significant fatigue and brittle nails. 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 Patient has previously required IV iron about 5-6 years ago. She is uncertain what IV iron preparation she received but feels  that it was likely iron dextran. She had no issues tolerating it at that time.  Patient has received IV feraheme 510 mg Qweekly x 3 doses in 10/2015 and is here for followup Plan -patient chooses not to have new labs today in our clinic. -She prefers to continue followup with her primary care physician to monitor her labs and reports that she will call us if she needs [place]. -Would recommend repeating ferritin, iron profile, B12 levels to reassess their status. -Continue aggressive vitamin D replacement and other trace element replacements as per endocrinology/primary care physician. -All the patient's questions and concerns were answered in detail to her apparent satisfaction.  Return to care with Dr. Irene Limbo as needed as per patients preference. Would recommend monitoring ferritin, iron profile atleast q59months and replace with IV iron for ferritin <50.  I spent 15 minutes counseling the patient face to face. The total time spent in the appointment was 15 minutes and more than 50% was  on counseling and direct patient cares.    Sullivan Lone MD [place] AAHIVMS [facility] Hematology/Oncology Physician [facility]  (Office):       [phone] (Work cell):  [phone] (Fax):           [phone]

## 2016-05-06 ENCOUNTER — Other Ambulatory Visit: Payer: Self-pay | Admitting: Obstetrics and Gynecology

## 2016-05-08 LAB — CYTOLOGY - PAP

## 2016-09-07 ENCOUNTER — Emergency Department (HOSPITAL_BASED_OUTPATIENT_CLINIC_OR_DEPARTMENT_OTHER): Payer: 59

## 2016-09-07 ENCOUNTER — Emergency Department (HOSPITAL_BASED_OUTPATIENT_CLINIC_OR_DEPARTMENT_OTHER)
Admission: EM | Admit: 2016-09-07 | Discharge: 2016-09-07 | Disposition: A | Discharge status: Home / Self Care | Payer: 59 | Attending: Emergency Medicine | Admitting: Emergency Medicine

## 2016-09-07 ENCOUNTER — Encounter (HOSPITAL_BASED_OUTPATIENT_CLINIC_OR_DEPARTMENT_OTHER): Payer: Self-pay | Admitting: *Deleted

## 2016-09-07 DIAGNOSIS — R1031 Right lower quadrant pain: Secondary | ICD-10-CM | POA: Insufficient documentation

## 2016-09-07 DIAGNOSIS — R6883 Chills (without fever): Secondary | ICD-10-CM | POA: Insufficient documentation

## 2016-09-07 LAB — BASIC METABOLIC PANEL
Anion gap: 6 (ref 5–15)
BUN: 9 mg/dL (ref 6–20)
CO2: 23 mmol/L (ref 22–32)
Calcium: 8.9 mg/dL (ref 8.9–10.3)
Chloride: 109 mmol/L (ref 101–111)
Creatinine, Ser: 0.59 mg/dL (ref 0.44–1.00)
GFR calc Af Amer: >60 mL/min (ref >60)
GFR calc non Af Amer: >60 mL/min (ref >60)
Glucose, Bld: 113 mg/dL — ABNORMAL HIGH (ref 65–99)
Potassium: 4.6 mmol/L (ref 3.5–5.1)
Sodium: 138 mmol/L (ref 135–145)

## 2016-09-07 LAB — CBC WITH DIFFERENTIAL/PLATELET
Basophils Absolute: 0 10*3/uL (ref 0.0–0.1)
Basophils Relative: 0 %
Eosinophils Absolute: 0 10*3/uL (ref 0.0–0.7)
Eosinophils Relative: 0 %
HCT: 41.3 % (ref 36.0–46.0)
Hemoglobin: 13.3 g/dL (ref 12.0–15.0)
Lymphocytes Relative: 9 %
Lymphs Abs: 0.5 10*3/uL — ABNORMAL LOW (ref 0.7–4.0)
MCH: 31.2 pg (ref 26.0–34.0)
MCHC: 32.2 g/dL (ref 30.0–36.0)
MCV: 96.9 fL (ref 78.0–100.0)
Monocytes Absolute: 0.2 10*3/uL (ref 0.1–1.0)
Monocytes Relative: 4 %
Neutro Abs: 4.8 10*3/uL (ref 1.7–7.7)
Neutrophils Relative %: 87 %
Platelets: 133 10*3/uL — ABNORMAL LOW (ref 150–400)
RBC: 4.26 MIL/uL (ref 3.87–5.11)
RDW: 13.4 % (ref 11.5–15.5)
WBC: 5.5 10*3/uL (ref 4.0–10.5)

## 2016-09-07 LAB — URINE MICROSCOPIC-ADD ON

## 2016-09-07 LAB — URINALYSIS, ROUTINE W REFLEX MICROSCOPIC
Bilirubin Urine: NEGATIVE
Glucose, UA: NEGATIVE mg/dL
HGB URINE DIPSTICK: NEGATIVE
Ketones, ur: NEGATIVE mg/dL
Nitrite: NEGATIVE
Protein, ur: NEGATIVE mg/dL
SPECIFIC GRAVITY, URINE: 1.017 (ref 1.005–1.030)
pH: 5.5 (ref 5.0–8.0)

## 2016-09-07 MED ORDER — NAPROXEN 500 MG PO TABS: 500.0000 mg | ORAL_TABLET | Freq: Two times a day (BID) | ORAL | 0 refills | Status: DC

## 2016-09-07 MED ORDER — NAPROXEN 250 MG PO TABS
500.0000 mg | ORAL_TABLET | Freq: Once | ORAL | Status: AC
  Administered 2016-09-07: 500 mg via ORAL
  Filled 2016-09-07: qty 2

## 2016-09-07 MED ORDER — MORPHINE SULFATE (PF) 4 MG/ML IV SOLN: 4.0000 mg | Freq: Once | INTRAVENOUS | Status: DC

## 2016-09-07 MED ORDER — MORPHINE SULFATE (PF) 4 MG/ML IV SOLN
4.0000 mg | Freq: Once | INTRAVENOUS | Status: AC
Start: 2016-09-07 — End: 2016-09-07
  Administered 2016-09-07: 4 mg via INTRAVENOUS
  Filled 2016-09-07: qty 1

## 2016-09-07 MED ORDER — SODIUM CHLORIDE 0.9 % IV BOLUS (SEPSIS)
1000.0000 mL | Freq: Once | INTRAVENOUS | Status: AC
  Administered 2016-09-07: 1000 mL via INTRAVENOUS

## 2016-09-07 MED ORDER — IOPAMIDOL (ISOVUE-300) INJECTION 61%
100.0000 mL | Freq: Once | INTRAVENOUS | Status: AC | PRN
  Administered 2016-09-07: 100 mL via INTRAVENOUS

## 2016-09-07 MED ORDER — ONDANSETRON HCL 4 MG/2ML IJ SOLN
4.0000 mg | Freq: Once | INTRAMUSCULAR | Status: AC
Start: 2016-09-07 — End: 2016-09-07
  Administered 2016-09-07: 4 mg via INTRAVENOUS
  Filled 2016-09-07: qty 2

## 2016-09-07 MED ORDER — AMOXICILLIN-POT CLAVULANATE 875-125 MG PO TABS: 1.0000 | ORAL_TABLET | Freq: Two times a day (BID) | ORAL | 0 refills | Status: DC

## 2016-09-07 MED FILL — AMOX-CLAV 875-125 MG TABLET: 875-125 | 10 days supply | Qty: 20 | Fill #0

## 2016-09-07 MED FILL — NAPROXEN 500 MG TABLET: 500 | 10 days supply | Qty: 20 | Fill #0

## 2016-09-07 NOTE — ED Triage Notes (Signed)
Right lower quadrant pain woke her at 1am. No relief with heating pad. She was seen at [facility] this am and had a negative urine.

## 2016-09-07 NOTE — Discharge Instructions (Signed)
Medications: Augmentin, Naprosyn  Treatment: Take Augmentin as prescribed for 10 days. Take Naprosyn twice daily as prescribed, as needed for pain.  Follow-up: Please follow-up with your primary care provider as needed if symptoms are not improving. Please return to emergency department if he develop any new or worsening symptoms.

## 2016-09-07 NOTE — ED Provider Notes (Signed)
[facility] Provider Note   CSN: [id number] Arrival date & time: 09/07/16  1112     History   Chief Complaint Chief Complaint  Patient presents with  . Abdominal Pain    HPI Jennifer Harrell is a 50 y.o. female who presents with sudden onset right lower quadrant pain that began around 1 AM this morning. Patient describes her pain as sharp and has been getting worse as time has gone on. Asian reports only certain positions making her comfortable such as fetal position and laying with her knees up. Patient has had not had very much nausea, but she notes that she has not eaten or drank very much. Patient had some graham crackers and Gatorade earlier this morning. Patient was seen in urgent care prior to coming and had a normal urine specimen, however she was sent here for further evaluation of appendicitis. Patient is menopausal, she has no concern for STD exposure. She denies any vaginal discharge or pelvic pain. Patient has had associated chills, but no fever. Patient denies any chest pain, shortness of breath, vomiting, or urinary symptoms.  HPI  Past Medical History:  Diagnosis Date  . Anemia   . Fibromyalgia   . History of breast surgery 1983   reduction  . Vitamin D deficiency     Patient Active Problem List   Diagnosis Date Noted  . Iron deficiency anemia 10/10/2015  . Right hip pain 01/18/2015  . Bilateral knee pain 09/18/2014  . Internal bleeding hemorrhoids 06/20/2013  . Guaiac positive stools 02/26/2013  . Zinc deficiency 10/24/2012  . Vitamin A deficiency 08/25/2012  . Sinus bradycardia 08/15/2012  . Allergic rhinitis due to allergen 08/15/2012  . Vitamin D deficiency 05/12/2012  . History of anemia 05/10/2012  . History of fibromyalgia 05/10/2012  . Lymphedema 05/10/2012  . Malabsorption syndrome 05/10/2012  . Seborrheic dermatitis of scalp 05/10/2012  . 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 S/P endometrial ablation 05/10/2012  . History of  abdominoplasty 05/10/2012  . DEFICIENCY OF OTHER VITAMINS 02/05/2010  . PREMATURE VENTRICULAR CONTRACTIONS 02/05/2010  . BRADYCARDIA 02/05/2010    Past Surgical History:  Procedure Laterality Date  . ABDOMINOPLASTY  2005  . BPDD weight loss surgery  2002  . breast reduction    . THIGH LIFT    . uterine ablation      OB History    Gravida Para Term Preterm AB Living   1 1 1     1    SAB TAB Ectopic Multiple Live Births                   Home Medications    Prior to Admission medications   Medication Sig Start Date End Date Taking? Authorizing Provider  amoxicillin-clavulanate (AUGMENTIN) 875-125 MG tablet Take 1 tablet by mouth every 12 (twelve) hours. 09/07/16   Frederica Kuster, PA-C  Multiple Vitamin (MULTIVITAMIN) tablet Take 1 tablet by mouth daily.    Historical Provider, MD  naproxen (NAPROSYN) 500 MG tablet Take 1 tablet (500 mg total) by mouth 2 (two) times daily. 09/07/16   Frederica Kuster, PA-C  Vitamin D, Ergocalciferol, (DRISDOL) 50000 UNITS CAPS capsule  10/27/13   Historical Provider, MD    Family History Family History  Problem Relation Age of Onset  . Cancer Maternal Grandfather     stomach  . Diabetes Maternal Grandfather   . Heart failure Maternal Grandfather   . Diabetes Mother   . Heart failure Maternal Grandmother  Social History Social History  Substance Use Topics  . Smoking status: Never Smoker  . Smokeless tobacco: Never Used  . Alcohol use No     Allergies   Ciprofloxacin; Prednisone; Sulfa antibiotics; and Sulfonamide derivatives   Review of Systems Review of Systems  Constitutional: Positive for chills. Negative for fever.  HENT: Negative for facial swelling and sore throat.   Respiratory: Negative for shortness of breath.   Cardiovascular: Negative for chest pain.  Gastrointestinal: Positive for abdominal pain. Negative for nausea and vomiting.  Genitourinary: Negative for dysuria.  Musculoskeletal: Negative for back pain.    Skin: Negative for rash and wound.  Neurological: Negative for headaches.  Psychiatric/Behavioral: The patient is not nervous/anxious.      Physical Exam Updated Vital Signs BP 138/67   Pulse (!) 49   Temp 98.3 F (36.8 C) (Oral)   Resp 18   Ht 5' 7.5" (1.715 m)   Wt 103.9 kg   SpO2 100%   BMI 35.34 kg/m   Physical Exam  Constitutional: She appears well-developed and well-nourished. No distress.  HENT:  Head: Normocephalic and atraumatic.  Mouth/Throat: Oropharynx is clear and moist. No oropharyngeal exudate.  Eyes: Conjunctivae are normal. Pupils are equal, round, and reactive to light. Right eye exhibits no discharge. Left eye exhibits no discharge. No scleral icterus.  Neck: Normal range of motion. Neck supple. No thyromegaly present.  Cardiovascular: Normal rate, regular rhythm, normal heart sounds and intact distal pulses.  Exam reveals no gallop and no friction rub.   No murmur heard. Pulmonary/Chest: Effort normal and breath sounds normal. No stridor. No respiratory distress. She has no wheezes. She has no rales.  Abdominal: Soft. Bowel sounds are normal. She exhibits no distension. There is tenderness in the right lower quadrant. There is tenderness at McBurney's point. There is no rebound, no guarding, no CVA tenderness and negative Murphy's sign.    Positive McBurney's point, negative Rovsing's, positive obturator sign  Musculoskeletal: She exhibits no edema.  Lymphadenopathy:    She has no cervical adenopathy.  Neurological: She is alert. Coordination normal.  Skin: Skin is warm and dry. No rash noted. She is not diaphoretic. No pallor.  Psychiatric: She has a normal mood and affect.  Nursing note and vitals reviewed.    ED Treatments / Results  Labs (all labs ordered are listed, but only abnormal results are displayed) Labs Reviewed  URINALYSIS, ROUTINE W REFLEX MICROSCOPIC (NOT AT [facility] : [facility]) - Abnormal; Notable for the following:       Result Value    APPearance CLOUDY (*)    Leukocytes, UA SMALL (*)    All other components within normal limits  BASIC METABOLIC PANEL - Abnormal; Notable for the following:    Glucose, Bld 113 (*)    All other components within normal limits  CBC WITH DIFFERENTIAL/PLATELET - Abnormal; Notable for the following:    Platelets 133 (*)    Lymphs Abs 0.5 (*)    All other components within normal limits  URINE MICROSCOPIC-ADD ON - Abnormal; Notable for the following:    Squamous Epithelial / LPF 6-30 (*)    Bacteria, UA FEW (*)    All other components within normal limits    EKG  EKG Interpretation None       Radiology Ct Abdomen Pelvis W Contrast  Result Date: 09/07/2016 CLINICAL DATA:  Right lower quadrant pain.  Chills. EXAM: CT ABDOMEN AND PELVIS WITH CONTRAST TECHNIQUE: Multidetector CT imaging of the abdomen and pelvis was performed  using the standard protocol following bolus administration of intravenous contrast. Oral contrast was also administered. CONTRAST:  112mL ISOVUE-300 IOPAMIDOL (ISOVUE-300) INJECTION 61% COMPARISON:  None. FINDINGS: Lower chest: Lung bases are clear.  There is a small hiatal hernia. Hepatobiliary: No focal liver lesions are evident. There is mild periportal edema. Portal vein is patent. Gallbladder is absent. There is no appreciable biliary duct dilatation. Pancreas: No pancreatic mass or inflammatory focus. Pancreatic duct is upper normal in size. Spleen: No splenic lesions are evident. Adrenals/Urinary Tract: Kidneys bilaterally show no mass or hydronephrosis on either side. There is no renal or ureteral calculus on either side. Urinary bladder is midline with wall thickness within normal limits. Stomach/Bowel: Surgical clips are noted in the perigastric region consistent with previous bariatric procedure. There is no bowel wall thickening or fluid in this area. There is no demonstrable bowel obstruction. There are multiple loops of small bowel which demonstrate wall  thickening. Wall thickening is also noted in portions of the descending and sigmoid colon regions. There is no associated mesenteric thickening. No free air or portal venous air is evident. Vascular/Lymphatic: There is no abdominal aortic aneurysm. No vascular lesions are evident on this study. There is no adenopathy in the abdomen or pelvis. Reproductive: Uterus is anteverted. There is no pelvic mass. There is a small amount of free fluid in cul-de-sac. Other: Appendix appears normal. No abscess evident in the abdomen or pelvis. No ascites beyond small amount of free fluid in the dependent portion of pelvis. Musculoskeletal: There is scarring in the periumbilical region. There are no blastic or lytic bone lesions. There is no intramuscular lesion. IMPRESSION: Loops of thickened small bowel as well as distal colon consistent with enteritis colitis, nonspecific. No demonstrable bowel obstruction. Patient has had previous bariatric surgery without complicating feature noted perigastric/left upper quadrant region. Gallbladder absent. There is a small hiatal hernia. No renal or ureteral calculi. No hydronephrosis. Appendix appears normal. No abscess. Small amount of free fluid in the pelvis. This fluid could be secondary to the enteritis and colitis or could represent recent ovarian cyst rupture. There is mild periportal edema. Etiology for this finding is uncertain. This finding potentially could indicate underlying hepatic parenchymal disease. Appropriate laboratory correlation advised. Electronically Signed   By: Lowella Grip III M.D.   On: 09/07/2016 13:17    Procedures Procedures (including critical care time)  Medications Ordered in ED Medications  sodium chloride 0.9 % bolus 1,000 mL (0 mLs Intravenous Stopped 09/07/16 1438)  ondansetron (ZOFRAN) injection 4 mg (4 mg Intravenous Given 09/07/16 1237)  morphine 4 MG/ML injection 4 mg (4 mg Intravenous Given 09/07/16 1237)  iopamidol (ISOVUE-300) 61 %  injection 100 mL (100 mLs Intravenous Contrast Given 09/07/16 1256)  naproxen (NAPROSYN) tablet 500 mg (500 mg Oral Given 09/07/16 1501)     Initial Impression / Assessment and Plan / ED Course  I have reviewed the triage vital signs and the nursing notes.  Pertinent labs & imaging results that were available during my care of the patient were reviewed by me and considered in my medical decision making (see chart for details).  Clinical Course    CBC shows platelets 133. BMP unremarkable. UA shows small leukocytes, few bacteria, 0-5 wbc's, squamous epithelial cells 6-30. CT abdomen and pelvis shows [Loops of thickened small bowel as well as distal colon consistent with enteritis colitis, nonspecific. No demonstrable bowel obstruction. Patient has had previous bariatric surgery without complicating feature noted perigastric/left upper quadrant region. Gallbladder absent. There  is a small hiatal hernia. No renal or ureteral calculi. No hydronephrosis. Appendix appears normal. No abscess. Small amount of free fluid in the pelvis. This fluid could be secondary to the enteritis and colitis or could represent recent ovarian cyst rupture. There is mild periportal edema. Etiology for this finding is uncertain. This finding potentially could indicate underlying hepatic parenchymal disease. Appropriate laboratory correlation advised.] I have made her PCP aware via Epic message for further evaluation of the incidental finding of mild periportal edema. I will treat patient with Augmentin, she has a ciprofloxacin allergy. I'll also discharged patient home with Naprosyn for pain control if pain is being caused by ovarian cyst rupture. Strict return precautions discussed. Patient understands and agrees with plan. Patient discharged in satisfactory condition. I discussed patient case with Dr. Laverta Baltimore who guided the patient's management and agrees with plan.  Final Clinical Impressions(s) / ED Diagnoses   Final  diagnoses:  RLQ abdominal pain    New Prescriptions Discharge Medication List as of 09/07/2016  2:42 PM    START taking these medications   Details  amoxicillin-clavulanate (AUGMENTIN) 875-125 MG tablet Take 1 tablet by mouth every 12 (twelve) hours., Starting Mon 09/07/2016, Print    naproxen (NAPROSYN) 500 MG tablet Take 1 tablet (500 mg total) by mouth 2 (two) times daily., Starting Mon 09/07/2016, Print         Frederica Kuster, PA-C 09/07/16 1749    Margette Fast, MD 09/07/16 1925

## 2016-09-14 ENCOUNTER — Other Ambulatory Visit: Payer: Self-pay | Admitting: Internal Medicine

## 2016-09-14 DIAGNOSIS — R1031 Right lower quadrant pain: Secondary | ICD-10-CM

## 2016-09-18 ENCOUNTER — Ambulatory Visit
Admission: RE | Admit: 2016-09-18 | Discharge: 2016-09-18 | Disposition: A | Discharge status: Home / Self Care | Payer: 59 | Source: Ambulatory Visit | Attending: Internal Medicine | Admitting: Internal Medicine

## 2016-09-18 DIAGNOSIS — R1031 Right lower quadrant pain: Secondary | ICD-10-CM

## 2016-12-31 DIAGNOSIS — N390 Urinary tract infection, site not specified: Secondary | ICD-10-CM | POA: Diagnosis not present

## 2017-01-05 DIAGNOSIS — Z9884 Bariatric surgery status: Secondary | ICD-10-CM | POA: Diagnosis not present

## 2017-01-05 DIAGNOSIS — K921 Melena: Secondary | ICD-10-CM | POA: Diagnosis not present

## 2017-01-05 DIAGNOSIS — D509 Iron deficiency anemia, unspecified: Secondary | ICD-10-CM | POA: Diagnosis not present

## 2017-01-13 DIAGNOSIS — Z9884 Bariatric surgery status: Secondary | ICD-10-CM | POA: Diagnosis not present

## 2017-01-13 DIAGNOSIS — E559 Vitamin D deficiency, unspecified: Secondary | ICD-10-CM | POA: Diagnosis not present

## 2017-01-13 DIAGNOSIS — E211 Secondary hyperparathyroidism, not elsewhere classified: Secondary | ICD-10-CM | POA: Diagnosis not present

## 2017-01-20 DIAGNOSIS — K640 First degree hemorrhoids: Secondary | ICD-10-CM | POA: Diagnosis not present

## 2017-01-20 DIAGNOSIS — R195 Other fecal abnormalities: Secondary | ICD-10-CM | POA: Diagnosis not present

## 2017-01-20 DIAGNOSIS — T183XXS Foreign body in small intestine, sequela: Secondary | ICD-10-CM | POA: Diagnosis not present

## 2017-01-25 ENCOUNTER — Other Ambulatory Visit: Payer: Self-pay

## 2017-01-25 ENCOUNTER — Other Ambulatory Visit: Payer: Self-pay | Admitting: Gastroenterology

## 2017-01-25 DIAGNOSIS — S30851A Superficial foreign body of abdominal wall, initial encounter: Secondary | ICD-10-CM

## 2017-01-29 ENCOUNTER — Ambulatory Visit
Admission: RE | Admit: 2017-01-29 | Discharge: 2017-01-29 | Disposition: A | Discharge status: Home / Self Care | Payer: 59 | Source: Ambulatory Visit | Attending: Gastroenterology | Admitting: Gastroenterology

## 2017-01-29 DIAGNOSIS — T182XXA Foreign body in stomach, initial encounter: Secondary | ICD-10-CM | POA: Diagnosis not present

## 2017-01-29 DIAGNOSIS — S30851A Superficial foreign body of abdominal wall, initial encounter: Secondary | ICD-10-CM

## 2017-01-29 MED ORDER — IOPAMIDOL (ISOVUE-300) INJECTION 61%
125.0000 mL | Freq: Once | INTRAVENOUS | Status: AC | PRN
  Administered 2017-01-29: 125 mL via INTRAVENOUS

## 2017-02-15 DIAGNOSIS — E559 Vitamin D deficiency, unspecified: Secondary | ICD-10-CM | POA: Diagnosis not present

## 2017-02-15 DIAGNOSIS — Z9884 Bariatric surgery status: Secondary | ICD-10-CM | POA: Diagnosis not present

## 2017-02-15 DIAGNOSIS — E211 Secondary hyperparathyroidism, not elsewhere classified: Secondary | ICD-10-CM | POA: Diagnosis not present

## 2017-02-22 DIAGNOSIS — R945 Abnormal results of liver function studies: Secondary | ICD-10-CM | POA: Diagnosis not present

## 2017-02-22 DIAGNOSIS — Z0000 Encounter for general adult medical examination without abnormal findings: Secondary | ICD-10-CM | POA: Diagnosis not present

## 2017-02-22 DIAGNOSIS — E559 Vitamin D deficiency, unspecified: Secondary | ICD-10-CM | POA: Diagnosis not present

## 2017-02-22 DIAGNOSIS — D509 Iron deficiency anemia, unspecified: Secondary | ICD-10-CM | POA: Diagnosis not present

## 2017-02-22 DIAGNOSIS — N390 Urinary tract infection, site not specified: Secondary | ICD-10-CM | POA: Diagnosis not present

## 2017-04-21 ENCOUNTER — Encounter (HOSPITAL_BASED_OUTPATIENT_CLINIC_OR_DEPARTMENT_OTHER): Payer: Self-pay

## 2017-04-21 ENCOUNTER — Emergency Department (HOSPITAL_BASED_OUTPATIENT_CLINIC_OR_DEPARTMENT_OTHER)
Admission: EM | Admit: 2017-04-21 | Discharge: 2017-04-21 | Disposition: A | Discharge status: Home / Self Care | Payer: 59 | Attending: Physician Assistant | Admitting: Physician Assistant

## 2017-04-21 DIAGNOSIS — N3000 Acute cystitis without hematuria: Secondary | ICD-10-CM | POA: Diagnosis not present

## 2017-04-21 DIAGNOSIS — R350 Frequency of micturition: Secondary | ICD-10-CM | POA: Diagnosis not present

## 2017-04-21 LAB — URINALYSIS, ROUTINE W REFLEX MICROSCOPIC
Glucose, UA: NEGATIVE mg/dL
KETONES UR: 15 mg/dL — AB
NITRITE: POSITIVE — AB
Protein, ur: 30 mg/dL — AB
Specific Gravity, Urine: 1.029 (ref 1.005–1.030)
pH: 5 (ref 5.0–8.0)

## 2017-04-21 LAB — URINALYSIS, MICROSCOPIC (REFLEX)

## 2017-04-21 MED ORDER — PHENAZOPYRIDINE HCL 100 MG PO TABS
ORAL_TABLET | ORAL | Status: AC
  Filled 2017-04-21: qty 2

## 2017-04-21 MED ORDER — CEPHALEXIN 250 MG PO CAPS
500.0000 mg | ORAL_CAPSULE | Freq: Once | ORAL | Status: AC
  Administered 2017-04-21: 500 mg via ORAL
  Filled 2017-04-21: qty 2

## 2017-04-21 MED ORDER — CEPHALEXIN 500 MG PO CAPS: 500.0000 mg | ORAL_CAPSULE | Freq: Four times a day (QID) | ORAL | 0 refills | Status: DC

## 2017-04-21 MED ORDER — PHENAZOPYRIDINE HCL 200 MG PO TABS: 200.0000 mg | ORAL_TABLET | Freq: Three times a day (TID) | ORAL | 0 refills | Status: DC

## 2017-04-21 MED ORDER — PHENAZOPYRIDINE HCL 100 MG PO TABS
200.0000 mg | ORAL_TABLET | Freq: Once | ORAL | Status: AC
  Administered 2017-04-21: 200 mg via ORAL

## 2017-04-21 NOTE — ED Notes (Signed)
C/o urinary freq, pressure and back pain x 2-3 days  Worse tonight  Hx of uti

## 2017-04-21 NOTE — ED Provider Notes (Signed)
[facility]PT [facility] Provider Note   CSN: [id number] Arrival date & time: 04/21/17  2017  By signing my name below, I, Collene Leyden, attest that this documentation has been prepared under the direction and in the presence of Jamaia Brum, Fredia Sorrow, MD. Electronically Signed: Collene Leyden, Scribe. 04/21/17. 9:27 PM.  History   Chief Complaint Chief Complaint  Patient presents with  . Urinary Frequency    The history is provided by the patient. No language interpreter was used.    HPI Comments: Jennifer Harrell is a 51 y.o. female who presents to the Emergency Department complaining of sudden-onset, intermittent urinary frequency that began 3 days ago. Pt has associated symptoms of pain with urination, and burning with urination. Pt has a hx of similar symptoms in the past due to gastrointestinal surgery. No treatments tried prior to arrival in the ED. Pt denies vomiting or any other complaints at this time.   Allergic to Sulfa Drug  Past Medical History:  Diagnosis Date  . Anemia   . Fibromyalgia   . History of breast surgery 1983   reduction  . Vitamin D deficiency     Patient Active Problem List   Diagnosis Date Noted  . Iron deficiency anemia 10/10/2015  . Right hip pain 01/18/2015  . Bilateral knee pain 09/18/2014  . Internal bleeding hemorrhoids 06/20/2013  . Guaiac positive stools 02/26/2013  . Zinc deficiency 10/24/2012  . Vitamin A deficiency 08/25/2012  . Sinus bradycardia 08/15/2012  . Allergic rhinitis due to allergen 08/15/2012  . Vitamin D deficiency 05/12/2012  . History of anemia 05/10/2012  . History of fibromyalgia 05/10/2012  . Lymphedema 05/10/2012  . Malabsorption syndrome 05/10/2012  . Seborrheic dermatitis of scalp 05/10/2012  . 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 S/P endometrial ablation 05/10/2012  . History of abdominoplasty 05/10/2012  . DEFICIENCY OF OTHER VITAMINS 02/05/2010  . PREMATURE VENTRICULAR CONTRACTIONS 02/05/2010  .  BRADYCARDIA 02/05/2010    Past Surgical History:  Procedure Laterality Date  . ABDOMINOPLASTY  2005  . BPDD weight loss surgery  2002  . breast reduction    . THIGH LIFT    . uterine ablation      OB History    Gravida Para Term Preterm AB Living   1 1 1     1    SAB TAB Ectopic Multiple Live Births                   Home Medications    Prior to Admission medications   Medication Sig Start Date End Date Taking? Authorizing Provider  Multiple Vitamin (MULTIVITAMIN) tablet Take 1 tablet by mouth daily.    [provider]  naproxen (NAPROSYN) 500 MG tablet Take 1 tablet (500 mg total) by mouth 2 (two) times daily. 09/07/16   Frederica Kuster, PA-C  Vitamin D, Ergocalciferol, (DRISDOL) 50000 UNITS CAPS capsule  10/27/13   [provider]    Family History Family History  Problem Relation Age of Onset  . Cancer Maternal Grandfather        stomach  . Diabetes Maternal Grandfather   . Heart failure Maternal Grandfather   . Diabetes Mother   . Heart failure Maternal Grandmother     Social History Social History  Substance Use Topics  . Smoking status: Never Smoker  . Smokeless tobacco: Never Used  . Alcohol use No     Allergies   Ciprofloxacin; Prednisone; Sulfa antibiotics; and Sulfonamide derivatives   Review of Systems Review  of Systems  Gastrointestinal: Negative for vomiting.  Genitourinary: Positive for dysuria and urgency.  All other systems reviewed and are negative.    Physical Exam Updated Vital Signs BP 118/79 (BP Location: Left Arm)   Pulse 84   Temp 98.8 F (37.1 C) (Oral)   Resp (!) 21   Ht 5\' 8"  (1.727 m)   Wt 233 lb 12.8 oz (106.1 kg)   SpO2 100%   BMI 35.55 kg/m   Physical Exam  Constitutional: She is oriented to person, place, and time. She appears well-developed and well-nourished.  HENT:  Head: Normocephalic and atraumatic.  Eyes: Conjunctivae are normal. Pupils are equal, round, and reactive to light.    Cardiovascular: Normal rate.   Pulmonary/Chest: Effort normal.  Abdominal: Soft. She exhibits no distension. There is no tenderness.  Neurological: She is alert and oriented to person, place, and time.  Skin: Skin is warm and dry.  Psychiatric: She has a normal mood and affect.  Nursing note and vitals reviewed.    ED Treatments / Results  DIAGNOSTIC STUDIES: Oxygen Saturation is 100% on RA, normal by my interpretation.    COORDINATION OF CARE: 9:22 PM Discussed treatment plan with pt at bedside and pt agreed to plan, which includes prescription for antibiotics and when to return to the ED.   Labs (all labs ordered are listed, but only abnormal results are displayed) Labs Reviewed  URINALYSIS, ROUTINE W REFLEX MICROSCOPIC - Abnormal; Notable for the following:       Result Value   Color, Urine ORANGE (*)    APPearance CLOUDY (*)    Hgb urine dipstick TRACE (*)    Bilirubin Urine SMALL (*)    Ketones, ur 15 (*)    Protein, ur 30 (*)    Nitrite POSITIVE (*)    Leukocytes, UA LARGE (*)    All other components within normal limits  URINALYSIS, MICROSCOPIC (REFLEX) - Abnormal; Notable for the following:    Bacteria, UA FEW (*)    Squamous Epithelial / LPF 0-5 (*)    All other components within normal limits    EKG  EKG Interpretation None       Radiology No results found.  Procedures Procedures (including critical care time)  Medications Ordered in ED Medications - No data to display   Initial Impression / Assessment and Plan / ED Course  I have reviewed the triage vital signs and the nursing notes.  Pertinent labs & imaging results that were available during my care of the patient were reviewed by me and considered in my medical decision making (see chart for details).     I personally performed the services described in this documentation, which was scribed in my presence. The recorded information has been reviewed and is accurate.   Patient is a  51 year old feel presented with urinary frequency, burning with urination. Patient is multiple UTIs given surgery that she's had in the past, never she is constipated she gets UTIs. Patient has similar feeling today. Patient has no nausea no vomiting no fevers. We'll treat for UTI.  Final Clinical Impressions(s) / ED Diagnoses   Final diagnoses:  None    New Prescriptions New Prescriptions   No medications on file       Macarthur Critchley, MD 04/21/17 2132

## 2017-04-21 NOTE — ED Triage Notes (Signed)
C/o urinary freq, pressure, lower back pain x 3 days-feels is UTI-NAD-steady gait

## 2017-04-21 NOTE — Discharge Instructions (Signed)
Return with fever, vomitng or other concerns.

## 2017-04-22 MED FILL — CEPHALEXIN 500 MG CAPSULE: 500 | 10 days supply | Qty: 40 | Fill #0

## 2017-04-22 MED FILL — PHENAZOPYRIDINE 200 MG TAB: 200 | 2 days supply | Qty: 6 | Fill #0

## 2017-04-28 DIAGNOSIS — R3915 Urgency of urination: Secondary | ICD-10-CM | POA: Diagnosis not present

## 2017-04-28 DIAGNOSIS — N3000 Acute cystitis without hematuria: Secondary | ICD-10-CM | POA: Diagnosis not present

## 2017-05-20 ENCOUNTER — Encounter (HOSPITAL_BASED_OUTPATIENT_CLINIC_OR_DEPARTMENT_OTHER): Payer: Self-pay

## 2017-05-20 ENCOUNTER — Emergency Department (HOSPITAL_BASED_OUTPATIENT_CLINIC_OR_DEPARTMENT_OTHER): Payer: 59

## 2017-05-20 DIAGNOSIS — Y929 Unspecified place or not applicable: Secondary | ICD-10-CM | POA: Diagnosis not present

## 2017-05-20 DIAGNOSIS — Y999 Unspecified external cause status: Secondary | ICD-10-CM | POA: Insufficient documentation

## 2017-05-20 DIAGNOSIS — W268XXA Contact with other sharp object(s), not elsewhere classified, initial encounter: Secondary | ICD-10-CM | POA: Insufficient documentation

## 2017-05-20 DIAGNOSIS — S9032XA Contusion of left foot, initial encounter: Secondary | ICD-10-CM | POA: Diagnosis not present

## 2017-05-20 DIAGNOSIS — Z79899 Other long term (current) drug therapy: Secondary | ICD-10-CM | POA: Insufficient documentation

## 2017-05-20 DIAGNOSIS — Y9301 Activity, walking, marching and hiking: Secondary | ICD-10-CM | POA: Diagnosis not present

## 2017-05-20 DIAGNOSIS — S99922A Unspecified injury of left foot, initial encounter: Secondary | ICD-10-CM | POA: Diagnosis present

## 2017-05-20 DIAGNOSIS — D649 Anemia, unspecified: Secondary | ICD-10-CM | POA: Insufficient documentation

## 2017-05-20 DIAGNOSIS — M79672 Pain in left foot: Secondary | ICD-10-CM | POA: Diagnosis not present

## 2017-05-20 NOTE — ED Triage Notes (Signed)
Pt c/o dropped a metal tape measure on left foot approx 7pm-NAD-presents to triage in w/c

## 2017-05-21 ENCOUNTER — Emergency Department (HOSPITAL_BASED_OUTPATIENT_CLINIC_OR_DEPARTMENT_OTHER)
Admission: EM | Admit: 2017-05-21 | Discharge: 2017-05-21 | Disposition: A | Discharge status: Home / Self Care | Payer: 59 | Attending: Emergency Medicine | Admitting: Emergency Medicine

## 2017-05-21 DIAGNOSIS — S9032XA Contusion of left foot, initial encounter: Secondary | ICD-10-CM

## 2017-05-21 MED ORDER — OXYCODONE-ACETAMINOPHEN 5-325 MG PO TABS
1.0000 | ORAL_TABLET | Freq: Once | ORAL | Status: AC
  Administered 2017-05-21: 1 via ORAL
  Filled 2017-05-21: qty 1

## 2017-05-21 MED ORDER — IBUPROFEN 800 MG PO TABS
800.0000 mg | ORAL_TABLET | Freq: Once | ORAL | Status: AC
  Administered 2017-05-21: 800 mg via ORAL
  Filled 2017-05-21: qty 1

## 2017-05-21 MED ORDER — OXYCODONE-ACETAMINOPHEN 5-325 MG PO TABS: 1.0000 | ORAL_TABLET | ORAL | 0 refills | Status: DC | PRN

## 2017-05-21 NOTE — Discharge Instructions (Signed)
Use crutches, wear post-op show as needed. Take ibuprofen, naproxen, or acetaminophen as needed for less severe pain.

## 2017-05-21 NOTE — ED Provider Notes (Signed)
[facility] DEPT [facility] Provider Note   CSN: [id number] Arrival date & time: 05/20/17  2300     History   Chief Complaint Chief Complaint  Patient presents with  . Foot Injury    HPI Jennifer Harrell is a 51 y.o. female.  The history is provided by the patient.  She was cleaning and a tape measure fell from a shelf and struck her on her left foot in the region of the first MTP joint. She is complaining of pain at 6/10. She is unable to get comfortable. She is unable to bear weight comfortably. She denies other injury.  Past Medical History:  Diagnosis Date  . Anemia   . Fibromyalgia   . History of breast surgery 1983   reduction  . Vitamin D deficiency     Patient Active Problem List   Diagnosis Date Noted  . Iron deficiency anemia 10/10/2015  . Right hip pain 01/18/2015  . Bilateral knee pain 09/18/2014  . Internal bleeding hemorrhoids 06/20/2013  . Guaiac positive stools 02/26/2013  . Zinc deficiency 10/24/2012  . Vitamin A deficiency 08/25/2012  . Sinus bradycardia 08/15/2012  . Allergic rhinitis due to allergen 08/15/2012  . Vitamin D deficiency 05/12/2012  . History of anemia 05/10/2012  . History of fibromyalgia 05/10/2012  . Lymphedema 05/10/2012  . Malabsorption syndrome 05/10/2012  . Seborrheic dermatitis of scalp 05/10/2012  . 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 S/P endometrial ablation 05/10/2012  . History of abdominoplasty 05/10/2012  . DEFICIENCY OF OTHER VITAMINS 02/05/2010  . PREMATURE VENTRICULAR CONTRACTIONS 02/05/2010  . BRADYCARDIA 02/05/2010    Past Surgical History:  Procedure Laterality Date  . ABDOMINOPLASTY  2005  . BPDD weight loss surgery  2002  . breast reduction    . THIGH LIFT    . uterine ablation      OB History    Gravida Para Term Preterm AB Living   1 1 1     1    SAB TAB Ectopic Multiple Live Births                   Home Medications    Prior to Admission medications   Medication Sig Start Date End  Date Taking? Authorizing Provider  Multiple Vitamin (MULTIVITAMIN) tablet Take 1 tablet by mouth daily.    [provider]  oxyCODONE-acetaminophen (PERCOCET) 5-325 MG tablet Take 1 tablet by mouth every 4 (four) hours as needed for moderate pain. 5/63/87   Delora Fuel, MD  Vitamin D, Ergocalciferol, (DRISDOL) 50000 UNITS CAPS capsule  10/27/13   [provider]    Family History Family History  Problem Relation Age of Onset  . Cancer Maternal Grandfather        stomach  . Diabetes Maternal Grandfather   . Heart failure Maternal Grandfather   . Diabetes Mother   . Heart failure Maternal Grandmother     Social History Social History  Substance Use Topics  . Smoking status: Never Smoker  . Smokeless tobacco: Never Used  . Alcohol use No     Allergies   Ciprofloxacin; Prednisone; Sulfa antibiotics; and Sulfonamide derivatives   Review of Systems Review of Systems  All other systems reviewed and are negative.    Physical Exam Updated Vital Signs BP 126/72 (BP Location: Right Arm)   Pulse 70   Temp 97.9 F (36.6 C) (Oral)   Resp 18   SpO2 99%   Physical Exam  Nursing note and vitals reviewed.  50 year  old female, resting comfortably and in no acute distress. Vital signs are normal. Oxygen saturation is 99%, which is normal. Head is normocephalic and atraumatic. PERRLA, EOMI. Oropharynx is clear. Neck is nontender and supple without adenopathy or JVD. Back is nontender and there is no CVA tenderness. Lungs are clear without rales, wheezes, or rhonchi. Chest is nontender. Heart has regular rate and rhythm without murmur. Abdomen is soft, flat, nontender without masses or hepatosplenomegaly and peristalsis is normoactive. Extremities have 2+ lymphedema present. There is mild soft tissue swelling in the medial aspect of the left foot. There is moderate tenderness to palpation over the left first MTP joint. Distal neurovascular exam is intact. Skin is  warm and dry without rash. Neurologic: Mental status is normal, cranial nerves are intact, there are no motor or sensory deficits.  ED Treatments / Results   Radiology Dg Foot Complete Left  Result Date: 05/20/2017 CLINICAL DATA:  Acute onset of left foot pain after dropping metal tape measure on foot. Initial encounter. EXAM: LEFT FOOT - COMPLETE 3+ VIEW COMPARISON:  Left ankle radiographs performed 06/30/2006 FINDINGS: There is no evidence of fracture or dislocation. Chronic pes planus is noted. A small plantar calcaneal spur is noted. The joint spaces are preserved. There is no evidence of talar subluxation; the subtalar joint is unremarkable in appearance. No significant soft tissue abnormalities are seen. IMPRESSION: 1. No evidence of fracture or dislocation. 2. Chronic pes planus noted. Electronically Signed   By: Garald Balding M.D.   On: 05/20/2017 23:49    Procedures Procedures (including critical care time)  Medications Ordered in ED Medications  oxyCODONE-acetaminophen (PERCOCET/ROXICET) 5-325 MG per tablet 1 tablet (not administered)  ibuprofen (ADVIL,MOTRIN) tablet 800 mg (800 mg Oral Given 05/21/17 0051)     Initial Impression / Assessment and Plan / ED Course  I have reviewed the triage vital signs and the nursing notes.  Pertinent imaging results that were available during my care of the patient were reviewed by me and considered in my medical decision making (see chart for details).  Contusion of left foot. X-ray show no evidence of fracture. She is placed in a postop shoe for comfort and given crutches to use as needed. She was given a dose of ibuprofen in the ED with little relief of pain. She's given a prescription for a small number of oxycodone-acetaminophen tablets. Advised on ice and elevation and use of over-the-counter NSAIDs or acetaminophen as needed for less severe pain. Follow-up with orthopedics if not improving.  Final Clinical Impressions(s) / ED Diagnoses     Final diagnoses:  Contusion of left foot, initial encounter    New Prescriptions New Prescriptions   OXYCODONE-ACETAMINOPHEN (PERCOCET) 5-325 MG TABLET    Take 1 tablet by mouth every 4 (four) hours as needed for moderate pain.     Delora Fuel, MD 04/88/89 [phone]

## 2017-08-18 DIAGNOSIS — Z9884 Bariatric surgery status: Secondary | ICD-10-CM | POA: Diagnosis not present

## 2017-08-18 DIAGNOSIS — E559 Vitamin D deficiency, unspecified: Secondary | ICD-10-CM | POA: Diagnosis not present

## 2017-08-18 DIAGNOSIS — E211 Secondary hyperparathyroidism, not elsewhere classified: Secondary | ICD-10-CM | POA: Diagnosis not present

## 2017-09-01 DIAGNOSIS — Z01419 Encounter for gynecological examination (general) (routine) without abnormal findings: Secondary | ICD-10-CM | POA: Diagnosis not present

## 2017-09-01 DIAGNOSIS — Z124 Encounter for screening for malignant neoplasm of cervix: Secondary | ICD-10-CM | POA: Diagnosis not present

## 2017-10-01 DIAGNOSIS — L989 Disorder of the skin and subcutaneous tissue, unspecified: Secondary | ICD-10-CM | POA: Diagnosis not present

## 2017-10-01 DIAGNOSIS — Z9884 Bariatric surgery status: Secondary | ICD-10-CM | POA: Diagnosis not present

## 2017-10-06 DIAGNOSIS — R945 Abnormal results of liver function studies: Secondary | ICD-10-CM | POA: Diagnosis not present

## 2017-12-16 DIAGNOSIS — M778 Other enthesopathies, not elsewhere classified: Secondary | ICD-10-CM | POA: Diagnosis not present

## 2017-12-16 DIAGNOSIS — H6981 Other specified disorders of Eustachian tube, right ear: Secondary | ICD-10-CM | POA: Diagnosis not present

## 2017-12-21 DIAGNOSIS — D2361 Other benign neoplasm of skin of right upper limb, including shoulder: Secondary | ICD-10-CM | POA: Diagnosis not present

## 2017-12-21 DIAGNOSIS — L810 Postinflammatory hyperpigmentation: Secondary | ICD-10-CM | POA: Diagnosis not present

## 2018-01-06 DIAGNOSIS — M7989 Other specified soft tissue disorders: Secondary | ICD-10-CM | POA: Diagnosis not present

## 2018-01-06 DIAGNOSIS — I890 Lymphedema, not elsewhere classified: Secondary | ICD-10-CM | POA: Diagnosis not present

## 2018-01-06 DIAGNOSIS — M79602 Pain in left arm: Secondary | ICD-10-CM | POA: Diagnosis not present

## 2018-02-07 DIAGNOSIS — E211 Secondary hyperparathyroidism, not elsewhere classified: Secondary | ICD-10-CM | POA: Diagnosis not present

## 2018-02-14 DIAGNOSIS — Z9884 Bariatric surgery status: Secondary | ICD-10-CM | POA: Diagnosis not present

## 2018-02-14 DIAGNOSIS — E559 Vitamin D deficiency, unspecified: Secondary | ICD-10-CM | POA: Diagnosis not present

## 2018-02-14 DIAGNOSIS — E211 Secondary hyperparathyroidism, not elsewhere classified: Secondary | ICD-10-CM | POA: Diagnosis not present

## 2018-02-21 DIAGNOSIS — I890 Lymphedema, not elsewhere classified: Secondary | ICD-10-CM | POA: Diagnosis not present

## 2018-02-25 IMAGING — CT CT ABD-PELV W/ CM
2 of 5 series · 15 of 46 positions shown, 17 images · IV contrast (APPLIED)
Comparison: None.

CLINICAL DATA: Right lower quadrant pain.  Chills.

EXAM:
CT ABDOMEN AND PELVIS WITH CONTRAST
TECHNIQUE: Multidetector CT imaging of the abdomen and pelvis was performed
using the standard protocol following bolus administration of
intravenous contrast. Oral contrast was also administered.
CONTRAST:  100mL WP4VIE-PII IOPAMIDOL (WP4VIE-PII) INJECTION 61%

[Series 2: axial st · axial · 0.98mm/px · z∈[-481,-76]mm · 12 of 93 slices shown, 14 images]
[im 6/93  soft-tissue]
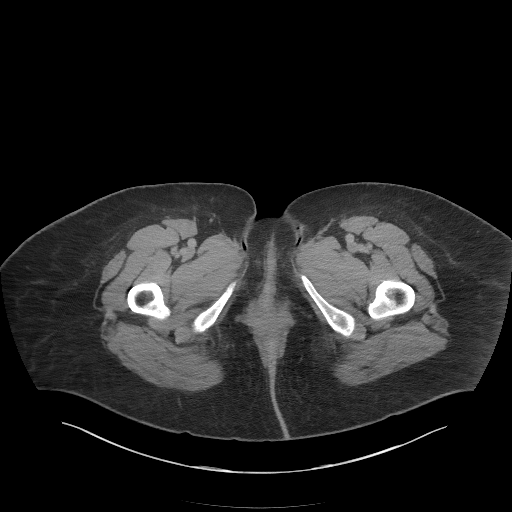
[im 6/93  bone]
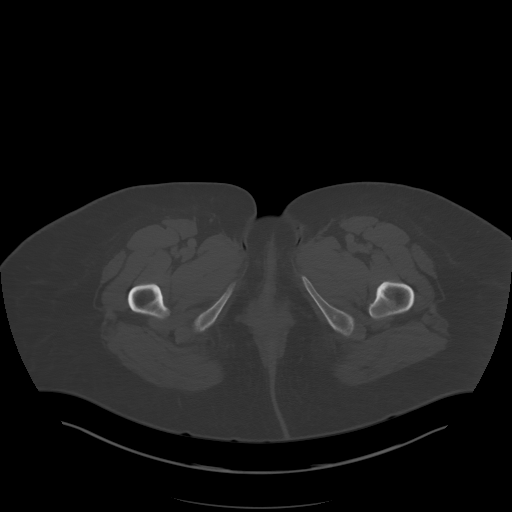
[im 16/93  soft-tissue]
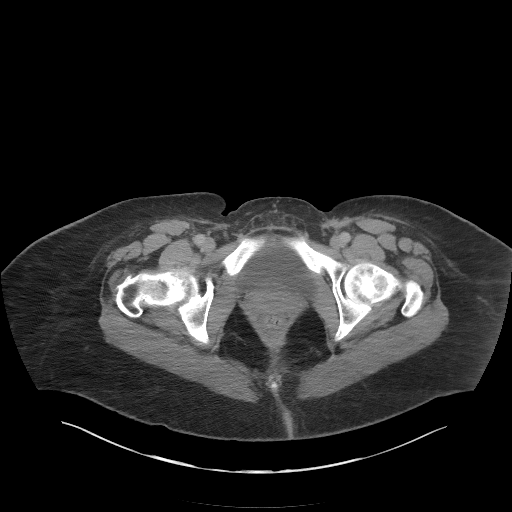
[im 21/93  soft-tissue]
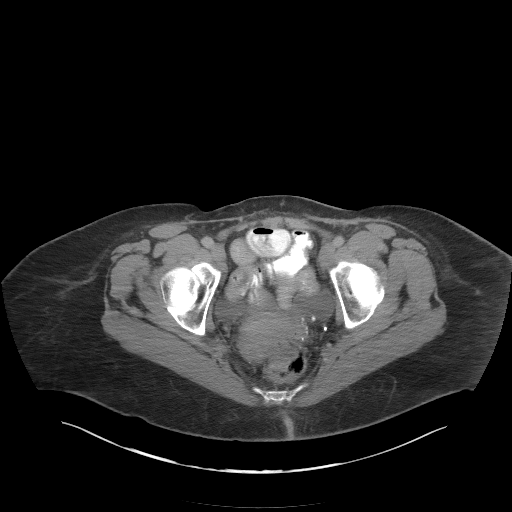
[im 26/93  soft-tissue]
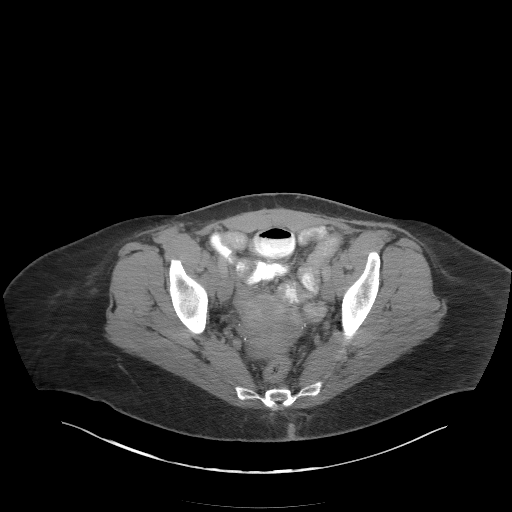
[im 36/93  soft-tissue]
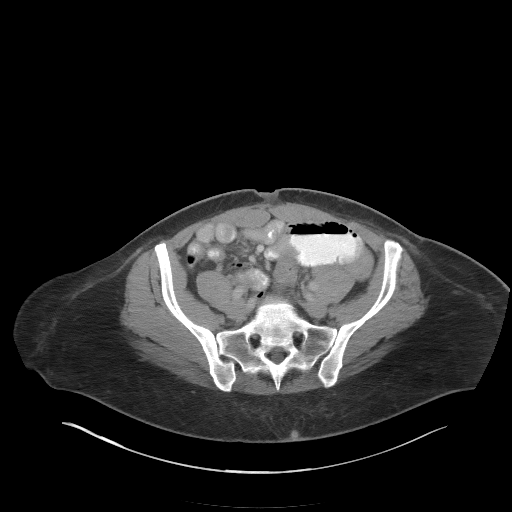
[im 41/93  soft-tissue]
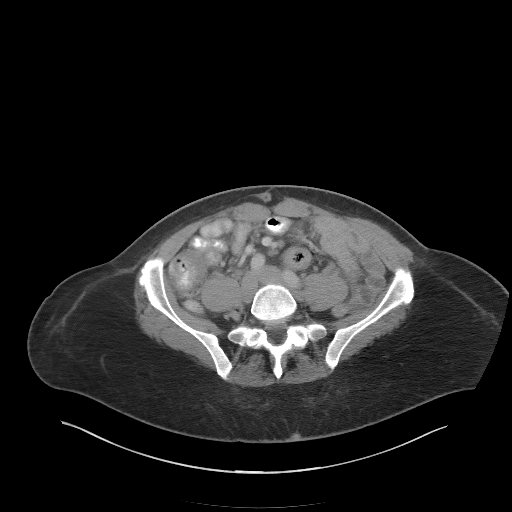
[im 52/93  soft-tissue]
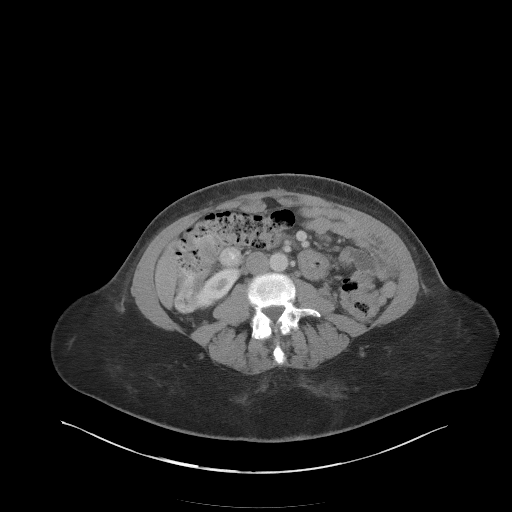
[im 57/93  soft-tissue]
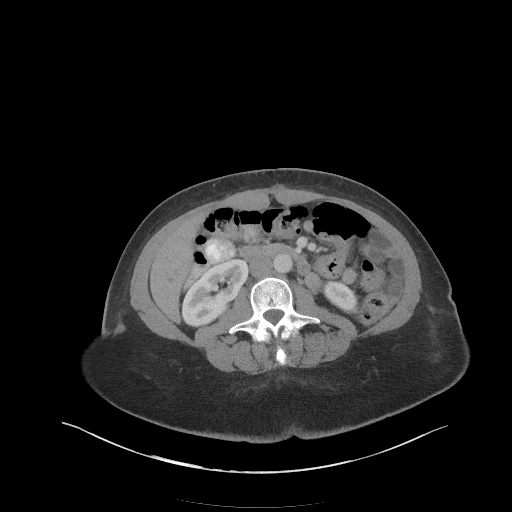
[im 67/93  soft-tissue]
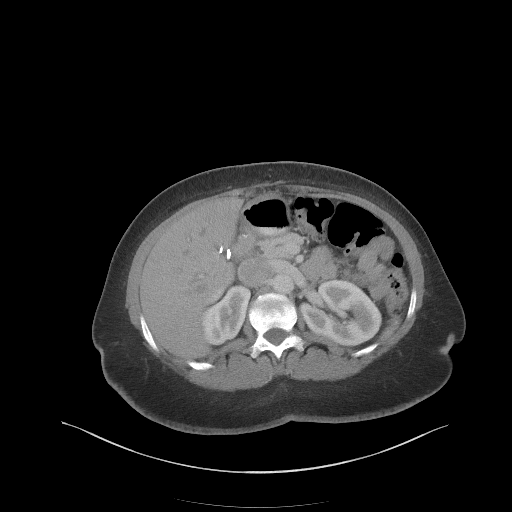
[im 67/93  bone]
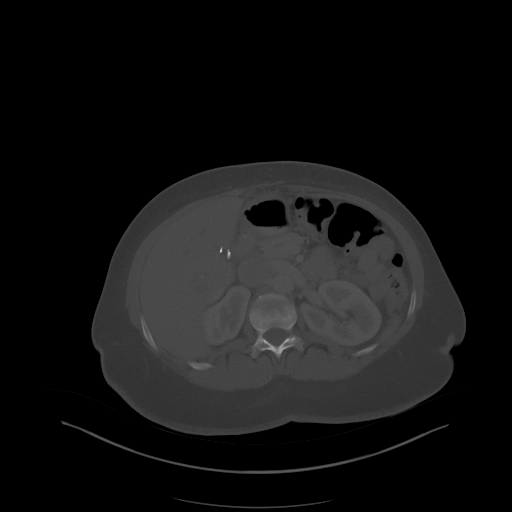
[im 72/93  soft-tissue]
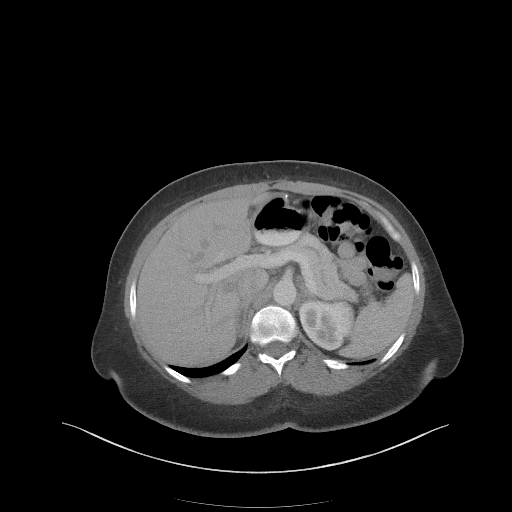
[im 77/93  soft-tissue]
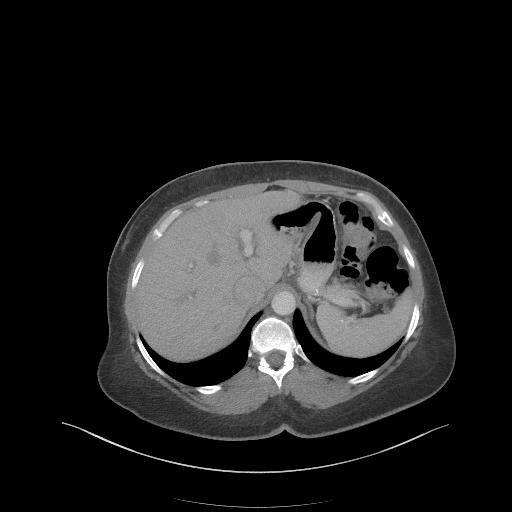
[im 87/93  soft-tissue]
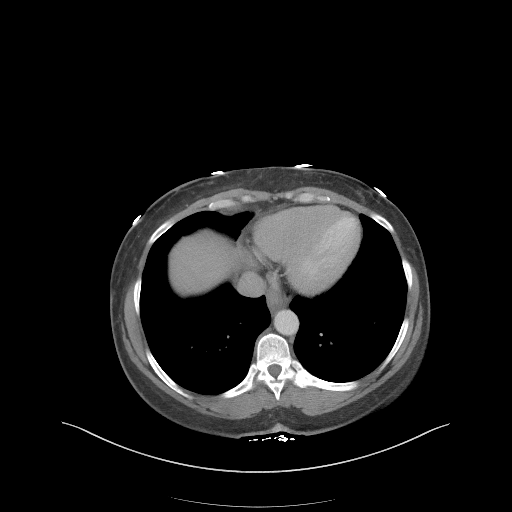

[Series 5: coronal st · coronal · 0.72mm/px · 3 of 94 slices shown]
[im 32/94  soft-tissue]
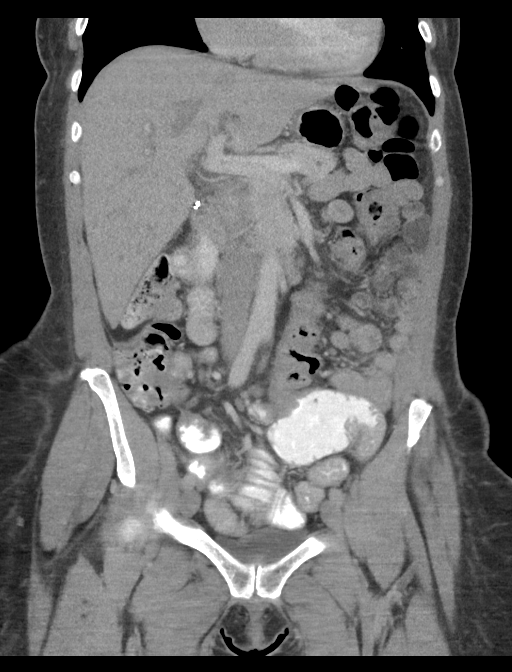
[im 42/94  soft-tissue]
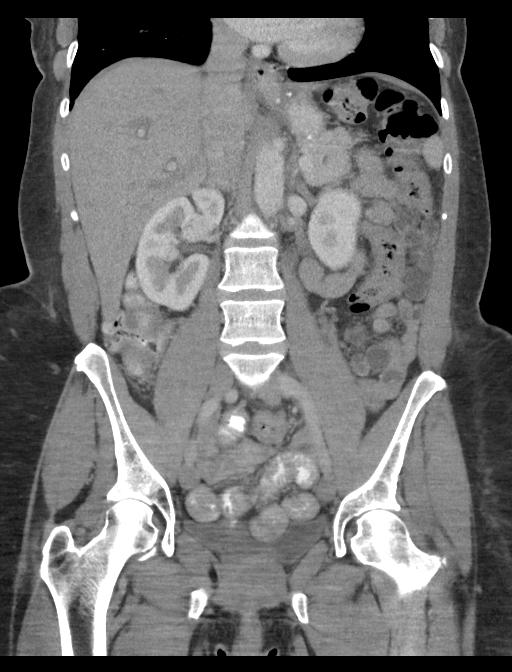
[im 52/94  soft-tissue]
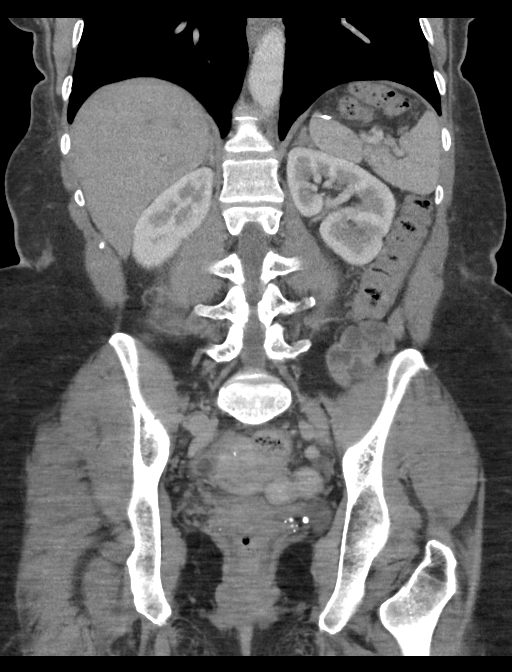

[15 of 46 positions shown; findings below may reference images not displayed]

FINDINGS: Lower chest: Lung bases are clear.  There is a small hiatal hernia.

Hepatobiliary: No focal liver lesions are evident. There is mild
periportal edema. Portal vein is patent. Gallbladder is absent.
There is no appreciable biliary duct dilatation.

Pancreas: No pancreatic mass or inflammatory focus. Pancreatic duct
is upper normal in size.

Spleen: No splenic lesions are evident.

Adrenals/Urinary Tract: Kidneys bilaterally show no mass or
hydronephrosis on either side. There is no renal or ureteral
calculus on either side. Urinary bladder is midline with wall
thickness within normal limits.

Stomach/Bowel: Surgical clips are noted in the perigastric region
consistent with previous bariatric procedure. There is no bowel wall
thickening or fluid in this area. There is no demonstrable bowel
obstruction. There are multiple loops of small bowel which
demonstrate wall thickening. Wall thickening is also noted in
portions of the descending and sigmoid colon regions. There is no
associated mesenteric thickening. No free air or portal venous air
is evident.

Vascular/Lymphatic: There is no abdominal aortic aneurysm. No
vascular lesions are evident on this study. There is no adenopathy
in the abdomen or pelvis.

Reproductive: Uterus is anteverted. There is no pelvic mass. There
is a small amount of free fluid in cul-de-sac.

Other: Appendix appears normal. No abscess evident in the abdomen or
pelvis. No ascites beyond small amount of free fluid in the
dependent portion of pelvis.

Musculoskeletal: There is scarring in the periumbilical region.
There are no blastic or lytic bone lesions. There is no
intramuscular lesion.
IMPRESSION: Loops of thickened small bowel as well as distal colon consistent
with enteritis colitis, nonspecific. No demonstrable bowel
obstruction. Patient has had previous bariatric surgery without
complicating feature noted perigastric/left upper quadrant region.
Gallbladder absent. There is a small hiatal hernia.

No renal or ureteral calculi. No hydronephrosis. Appendix appears
normal. No abscess.

Small amount of free fluid in the pelvis. This fluid could be
secondary to the enteritis and colitis or could represent recent
ovarian cyst rupture.

There is mild periportal edema. Etiology for this finding is
uncertain. This finding potentially could indicate underlying
hepatic parenchymal disease. Appropriate laboratory correlation
advised.

## 2018-02-28 DIAGNOSIS — I890 Lymphedema, not elsewhere classified: Secondary | ICD-10-CM | POA: Diagnosis not present

## 2018-03-16 DIAGNOSIS — N2581 Secondary hyperparathyroidism of renal origin: Secondary | ICD-10-CM | POA: Diagnosis not present

## 2018-03-16 DIAGNOSIS — Z9884 Bariatric surgery status: Secondary | ICD-10-CM | POA: Diagnosis not present

## 2018-03-16 DIAGNOSIS — Z0000 Encounter for general adult medical examination without abnormal findings: Secondary | ICD-10-CM | POA: Diagnosis not present

## 2018-03-16 DIAGNOSIS — D509 Iron deficiency anemia, unspecified: Secondary | ICD-10-CM | POA: Diagnosis not present

## 2018-03-16 DIAGNOSIS — I890 Lymphedema, not elsewhere classified: Secondary | ICD-10-CM | POA: Diagnosis not present

## 2018-03-17 DIAGNOSIS — I890 Lymphedema, not elsewhere classified: Secondary | ICD-10-CM | POA: Diagnosis not present

## 2018-05-26 DIAGNOSIS — M8588 Other specified disorders of bone density and structure, other site: Secondary | ICD-10-CM | POA: Diagnosis not present

## 2018-09-19 DIAGNOSIS — E669 Obesity, unspecified: Secondary | ICD-10-CM | POA: Diagnosis not present

## 2018-09-19 DIAGNOSIS — E559 Vitamin D deficiency, unspecified: Secondary | ICD-10-CM | POA: Diagnosis not present

## 2018-09-19 DIAGNOSIS — I890 Lymphedema, not elsewhere classified: Secondary | ICD-10-CM | POA: Diagnosis not present

## 2018-10-17 DIAGNOSIS — Z01419 Encounter for gynecological examination (general) (routine) without abnormal findings: Secondary | ICD-10-CM | POA: Diagnosis not present

## 2018-10-17 DIAGNOSIS — Z124 Encounter for screening for malignant neoplasm of cervix: Secondary | ICD-10-CM | POA: Diagnosis not present

## 2018-10-17 DIAGNOSIS — Z1231 Encounter for screening mammogram for malignant neoplasm of breast: Secondary | ICD-10-CM | POA: Diagnosis not present

## 2019-01-19 DIAGNOSIS — J0100 Acute maxillary sinusitis, unspecified: Secondary | ICD-10-CM | POA: Diagnosis not present

## 2019-01-19 DIAGNOSIS — L989 Disorder of the skin and subcutaneous tissue, unspecified: Secondary | ICD-10-CM | POA: Diagnosis not present

## 2019-01-19 DIAGNOSIS — R05 Cough: Secondary | ICD-10-CM | POA: Diagnosis not present

## 2019-01-31 DIAGNOSIS — L821 Other seborrheic keratosis: Secondary | ICD-10-CM | POA: Diagnosis not present

## 2019-01-31 DIAGNOSIS — L97121 Non-pressure chronic ulcer of left thigh limited to breakdown of skin: Secondary | ICD-10-CM | POA: Diagnosis not present

## 2019-03-21 DIAGNOSIS — N2581 Secondary hyperparathyroidism of renal origin: Secondary | ICD-10-CM | POA: Diagnosis not present

## 2019-03-21 DIAGNOSIS — I890 Lymphedema, not elsewhere classified: Secondary | ICD-10-CM | POA: Diagnosis not present

## 2019-03-21 DIAGNOSIS — Z9884 Bariatric surgery status: Secondary | ICD-10-CM | POA: Diagnosis not present

## 2020-02-08 ENCOUNTER — Ambulatory Visit: Payer: 59 | Attending: Internal Medicine

## 2020-02-08 DIAGNOSIS — Z23 Encounter for immunization: Secondary | ICD-10-CM

## 2020-02-08 NOTE — Progress Notes (Signed)
   Covid-19 Vaccination Clinic  Name:  ONESTI CONARD    MRN: [id number] DOB: 01-24-1966  02/08/2020  Ms. Lafazia was observed post Covid-19 immunization for 15 minutes without incident. She was provided with Vaccine Information Sheet and instruction to access the V-Safe system.   Ms. Menden was instructed to call 911 with any severe reactions post vaccine: Marland Kitchen Difficulty breathing  . Swelling of face and throat  . A fast heartbeat  . A bad rash all over body  . Dizziness and weakness   Immunizations Administered    Name Date Dose VIS Date Route   Pfizer COVID-19 Vaccine 02/08/2020 11:27 AM 0.3 mL 11/10/2019 Intramuscular   Manufacturer: [place]   Lot: [id number]   [place]: [id number]

## 2020-03-04 ENCOUNTER — Ambulatory Visit: Payer: 59 | Attending: Internal Medicine

## 2020-03-04 DIAGNOSIS — Z23 Encounter for immunization: Secondary | ICD-10-CM

## 2020-03-04 NOTE — Progress Notes (Signed)
   Covid-19 Vaccination Clinic  Name:  Jennifer Harrell    MRN: [id number] DOB: 03/06/1966  03/04/2020  Jennifer Harrell was observed post Covid-19 immunization for 15 minutes without incident. She was provided with Vaccine Information Sheet and instruction to access the V-Safe system.   Jennifer Harrell was instructed to call 911 with any severe reactions post vaccine: Marland Kitchen Difficulty breathing  . Swelling of face and throat  . A fast heartbeat  . A bad rash all over body  . Dizziness and weakness   Immunizations Administered    Name Date Dose VIS Date Route   Pfizer COVID-19 Vaccine 03/04/2020  4:51 PM 0.3 mL 11/10/2019 Intramuscular   Manufacturer: [place]   Lot: [id number]   [place]: [id number]

## 2020-04-24 ENCOUNTER — Other Ambulatory Visit: Payer: Self-pay | Admitting: Internal Medicine

## 2020-04-24 DIAGNOSIS — M8580 Other specified disorders of bone density and structure, unspecified site: Secondary | ICD-10-CM

## 2020-04-24 DIAGNOSIS — R202 Paresthesia of skin: Secondary | ICD-10-CM

## 2020-05-23 ENCOUNTER — Ambulatory Visit
Admission: RE | Admit: 2020-05-23 | Discharge: 2020-05-23 | Disposition: A | Discharge status: Home / Self Care | Payer: 59 | Source: Ambulatory Visit | Attending: Internal Medicine | Admitting: Internal Medicine

## 2020-05-23 ENCOUNTER — Other Ambulatory Visit: Payer: Self-pay

## 2020-05-23 DIAGNOSIS — R202 Paresthesia of skin: Secondary | ICD-10-CM

## 2020-07-08 ENCOUNTER — Other Ambulatory Visit: Payer: Self-pay

## 2020-07-08 ENCOUNTER — Ambulatory Visit
Admission: RE | Admit: 2020-07-08 | Discharge: 2020-07-08 | Disposition: A | Discharge status: Home / Self Care | Payer: 59 | Source: Ambulatory Visit | Attending: Internal Medicine | Admitting: Internal Medicine

## 2020-07-08 DIAGNOSIS — M8580 Other specified disorders of bone density and structure, unspecified site: Secondary | ICD-10-CM

## 2021-12-08 ENCOUNTER — Emergency Department (HOSPITAL_BASED_OUTPATIENT_CLINIC_OR_DEPARTMENT_OTHER): Payer: 59

## 2021-12-08 ENCOUNTER — Encounter (HOSPITAL_BASED_OUTPATIENT_CLINIC_OR_DEPARTMENT_OTHER): Payer: Self-pay | Admitting: Emergency Medicine

## 2021-12-08 ENCOUNTER — Other Ambulatory Visit: Payer: Self-pay

## 2021-12-08 ENCOUNTER — Emergency Department (HOSPITAL_BASED_OUTPATIENT_CLINIC_OR_DEPARTMENT_OTHER)
Admission: EM | Admit: 2021-12-08 | Discharge: 2021-12-08 | Disposition: A | Discharge status: Home / Self Care | Payer: 59 | Attending: Emergency Medicine | Admitting: Emergency Medicine

## 2021-12-08 DIAGNOSIS — R0602 Shortness of breath: Secondary | ICD-10-CM | POA: Diagnosis not present

## 2021-12-08 DIAGNOSIS — R0789 Other chest pain: Secondary | ICD-10-CM | POA: Insufficient documentation

## 2021-12-08 DIAGNOSIS — R079 Chest pain, unspecified: Secondary | ICD-10-CM | POA: Diagnosis present

## 2021-12-08 LAB — BASIC METABOLIC PANEL
Anion gap: 10 (ref 5–15)
BUN: 15 mg/dL (ref 6–20)
CO2: 22 mmol/L (ref 22–32)
Calcium: 9.5 mg/dL (ref 8.9–10.3)
Chloride: 107 mmol/L (ref 98–111)
Creatinine, Ser: 0.84 mg/dL (ref 0.44–1.00)
GFR, Estimated: >60 mL/min (ref >60)
Glucose, Bld: 88 mg/dL (ref 70–99)
Potassium: 3.8 mmol/L (ref 3.5–5.1)
Sodium: 139 mmol/L (ref 135–145)

## 2021-12-08 LAB — CBC
HCT: 42.1 % (ref 36.0–46.0)
Hemoglobin: 13.5 g/dL (ref 12.0–15.0)
MCH: 30.1 pg (ref 26.0–34.0)
MCHC: 32.1 g/dL (ref 30.0–36.0)
MCV: 94 fL (ref 80.0–100.0)
Platelets: 199 10*3/uL (ref 150–400)
RBC: 4.48 MIL/uL (ref 3.87–5.11)
RDW: 12.7 % (ref 11.5–15.5)
WBC: 5.5 10*3/uL (ref 4.0–10.5)
nRBC: 0 % (ref 0.0–0.2)

## 2021-12-08 LAB — TROPONIN I (HIGH SENSITIVITY)
Troponin I (High Sensitivity): 3 ng/L (ref <18)
Troponin I (High Sensitivity): 2 ng/L (ref <18)

## 2021-12-08 LAB — PREGNANCY, URINE: Preg Test, Ur: NEGATIVE

## 2021-12-08 NOTE — ED Triage Notes (Signed)
Chest pain with some strange sensations to left arm since yesterday.  No SOB, no N/V.   Some sweating last night.

## 2021-12-08 NOTE — ED Provider Notes (Signed)
[facility] EMERGENCY DEPARTMENT Provider Note   CSN: [id number] Arrival date & time: 12/08/21  0805     History { Chief Complaint  Patient presents with   Chest Pain    CHIZARAM LATINO is a 56 y.o. female.  Patient is a 56 year old female with a history of fibromyalgia, anemia and lymphedema who presents with chest pain.  She says it started yesterday.  Its been fairly constant but waxes and wanes in intensity.  On the left chest and she has a tingling sensation in her left arm.  She says its not really worse with movement.  Not worse with exertion.  Not worse after eating.  There is no associated cough or congestion.  She did report a little bit of shortness of breath when she walked up some stairs but she was questioning whether that was related to her putting on some extra weight.  She has some chronic lymphedema and does not notice any change in her leg swelling.  No fevers.  No pleuritic symptoms.  No known history of heart problems in the past other than PVCs.  She is a non-smoker.      Home Medications Prior to Admission medications   Medication Sig Start Date End Date Taking? Authorizing Provider  Multiple Vitamin (MULTIVITAMIN) tablet Take 1 tablet by mouth daily.    [provider]  Vitamin D, Ergocalciferol, (DRISDOL) 50000 UNITS CAPS capsule  10/27/13   [provider]      Allergies    Ciprofloxacin, Prednisone, Sulfa antibiotics, and Sulfonamide derivatives    Review of Systems   Review of Systems  Constitutional:  Negative for chills, diaphoresis, fatigue and fever.  HENT:  Negative for congestion, rhinorrhea and sneezing.   Eyes: Negative.   Respiratory:  Positive for chest tightness and shortness of breath. Negative for cough.   Cardiovascular:  Positive for leg swelling (At baseline). Negative for chest pain.  Gastrointestinal:  Negative for abdominal pain, blood in stool, diarrhea, nausea and vomiting.  Genitourinary:  Negative  for difficulty urinating, flank pain, frequency and hematuria.  Musculoskeletal:  Negative for arthralgias and back pain.  Skin:  Negative for rash.  Neurological:  Negative for dizziness, speech difficulty, weakness, numbness and headaches.   Physical Exam Updated Vital Signs BP 122/70    Pulse (!) 48    Resp 17    Ht 5\' 7"  (1.702 m)    Wt 109.9 kg    SpO2 100%    BMI 37.93 kg/m  Physical Exam Constitutional:      Appearance: She is well-developed.  HENT:     Head: Normocephalic and atraumatic.  Eyes:     Pupils: Pupils are equal, round, and reactive to light.  Cardiovascular:     Rate and Rhythm: Normal rate and regular rhythm.     Heart sounds: Normal heart sounds.  Pulmonary:     Effort: Pulmonary effort is normal. No respiratory distress.     Breath sounds: Normal breath sounds. No wheezing or rales.  Chest:     Chest wall: Tenderness (Some reproducible tenderness to her left chest wall, no crepitus or deformity, no rashes) present.  Abdominal:     General: Bowel sounds are normal.     Palpations: Abdomen is soft.     Tenderness: There is no abdominal tenderness. There is no guarding or rebound.  Musculoskeletal:        General: Normal range of motion.     Cervical back: Normal range of motion  and neck supple.     Comments: Positive edema to lower extremities bilaterally, no calf tenderness  Lymphadenopathy:     Cervical: No cervical adenopathy.  Skin:    General: Skin is warm and dry.     Findings: No rash.  Neurological:     Mental Status: She is alert and oriented to person, place, and time.    ED Results / Procedures / Treatments   Labs (all labs ordered are listed, but only abnormal results are displayed) Labs Reviewed  BASIC METABOLIC PANEL  CBC  PREGNANCY, URINE  TROPONIN I (HIGH SENSITIVITY)  TROPONIN I (HIGH SENSITIVITY)    EKG EKG Interpretation  Date/Time:  Monday December 08 2021 08:16:18 EST Ventricular Rate:  56 PR Interval:  138 QRS  Duration: 98 QT Interval:  444 QTC Calculation: 429 R Axis:   38 Text Interpretation: Sinus rhythm Baseline wander in lead(s) III No old tracing to compare Confirmed by Malvin Johns [phone]) on 12/08/2021 8:29:08 AM  Radiology DG Chest 2 View  Result Date: 12/08/2021 CLINICAL DATA:  Chest pain EXAM: CHEST - 2 VIEW COMPARISON:  December 11 2013. FINDINGS: The heart size and mediastinal contours are within normal limits. Both lungs are clear. The visualized skeletal structures are unremarkable. Cholecystectomy clips. IMPRESSION: No active cardiopulmonary disease. Electronically Signed   By: Dahlia Bailiff M.D.   On: 12/08/2021 08:32    Procedures Procedures    Medications Ordered in ED Medications - No data to display  ED Course/ Medical Decision Making/ A&P                           Medical Decision Making  Patient is a 56 year old female who presents with chest pain.  It somewhat reproducible on palpation.  Her chest x-ray is clear without evidence of pneumonia or pneumothorax.  This was interpreted by me as well.  She has no clinical suggestions of pneumonia.  She does not have any clinical suggestions of aortic dissection or PE.  Her EKG was reviewed by me and shows no ischemic changes.  Her labs are nonconcerning.  She has had 2 negative troponins.  Her pain is improved in the ED.  She was discharged home in good condition.  She was encouraged to have close follow-up with her PCP.  Return precautions were given.  Final Clinical Impression(s) / ED Diagnoses Final diagnoses:  Nonspecific chest pain    Rx / DC Orders ED Discharge Orders     None         Malvin Johns, MD 12/08/21 1117

## 2021-12-08 NOTE — ED Notes (Signed)
Patient transported to X-ray 

## 2021-12-08 NOTE — Discharge Instructions (Signed)
Call to make an appointment to follow-up with your primary care doctor with [facility] physicians within the next few days.  Return to emergency room if you have any worsening symptoms.

## 2021-12-16 ENCOUNTER — Other Ambulatory Visit: Payer: Self-pay | Admitting: Internal Medicine

## 2021-12-16 DIAGNOSIS — R0789 Other chest pain: Secondary | ICD-10-CM

## 2021-12-24 ENCOUNTER — Ambulatory Visit
Admission: RE | Admit: 2021-12-24 | Discharge: 2021-12-24 | Disposition: A | Discharge status: Home / Self Care | Payer: 59 | Source: Ambulatory Visit | Attending: Internal Medicine | Admitting: Internal Medicine

## 2021-12-24 ENCOUNTER — Other Ambulatory Visit: Payer: Self-pay

## 2021-12-24 DIAGNOSIS — R0789 Other chest pain: Secondary | ICD-10-CM

## 2022-03-08 ENCOUNTER — Encounter (HOSPITAL_BASED_OUTPATIENT_CLINIC_OR_DEPARTMENT_OTHER): Payer: Self-pay | Admitting: Emergency Medicine

## 2022-03-08 ENCOUNTER — Emergency Department (HOSPITAL_BASED_OUTPATIENT_CLINIC_OR_DEPARTMENT_OTHER): Payer: 59

## 2022-03-08 ENCOUNTER — Emergency Department (HOSPITAL_BASED_OUTPATIENT_CLINIC_OR_DEPARTMENT_OTHER)
Admission: EM | Admit: 2022-03-08 | Discharge: 2022-03-08 | Disposition: A | Discharge status: Home / Self Care | Payer: 59 | Attending: Emergency Medicine | Admitting: Emergency Medicine

## 2022-03-08 ENCOUNTER — Other Ambulatory Visit: Payer: Self-pay

## 2022-03-08 DIAGNOSIS — N3000 Acute cystitis without hematuria: Secondary | ICD-10-CM | POA: Diagnosis not present

## 2022-03-08 DIAGNOSIS — R1032 Left lower quadrant pain: Secondary | ICD-10-CM | POA: Diagnosis present

## 2022-03-08 LAB — CBC WITH DIFFERENTIAL/PLATELET
Abs Immature Granulocytes: 0.02 10*3/uL (ref 0.00–0.07)
Basophils Absolute: 0 10*3/uL (ref 0.0–0.1)
Basophils Relative: 1 %
Eosinophils Absolute: 0.1 10*3/uL (ref 0.0–0.5)
Eosinophils Relative: 1 %
HCT: 39.2 % (ref 36.0–46.0)
Hemoglobin: 12.2 g/dL (ref 12.0–15.0)
Immature Granulocytes: 0 %
Lymphocytes Relative: 22 %
Lymphs Abs: 1.4 10*3/uL (ref 0.7–4.0)
MCH: 30.2 pg (ref 26.0–34.0)
MCHC: 31.1 g/dL (ref 30.0–36.0)
MCV: 97 fL (ref 80.0–100.0)
Monocytes Absolute: 0.7 10*3/uL (ref 0.1–1.0)
Monocytes Relative: 10 %
Neutro Abs: 4.3 10*3/uL (ref 1.7–7.7)
Neutrophils Relative %: 66 %
Platelets: 198 10*3/uL (ref 150–400)
RBC: 4.04 MIL/uL (ref 3.87–5.11)
RDW: 13.2 % (ref 11.5–15.5)
WBC: 6.4 10*3/uL (ref 4.0–10.5)
nRBC: 0 % (ref 0.0–0.2)

## 2022-03-08 LAB — URINALYSIS, ROUTINE W REFLEX MICROSCOPIC
Bilirubin Urine: NEGATIVE
Glucose, UA: NEGATIVE mg/dL
Ketones, ur: NEGATIVE mg/dL
Nitrite: NEGATIVE
Protein, ur: NEGATIVE mg/dL
Specific Gravity, Urine: 1.015 (ref 1.005–1.030)
pH: 5.5 (ref 5.0–8.0)

## 2022-03-08 LAB — BASIC METABOLIC PANEL
Anion gap: 6 (ref 5–15)
BUN: 10 mg/dL (ref 6–20)
CO2: 26 mmol/L (ref 22–32)
Calcium: 8.3 mg/dL — ABNORMAL LOW (ref 8.9–10.3)
Chloride: 110 mmol/L (ref 98–111)
Creatinine, Ser: 0.79 mg/dL (ref 0.44–1.00)
GFR, Estimated: >60 mL/min (ref >60)
Glucose, Bld: 87 mg/dL (ref 70–99)
Potassium: 3.7 mmol/L (ref 3.5–5.1)
Sodium: 142 mmol/L (ref 135–145)

## 2022-03-08 LAB — URINALYSIS, MICROSCOPIC (REFLEX)

## 2022-03-08 MED ORDER — CEPHALEXIN 250 MG PO CAPS: 250.0000 mg | ORAL_CAPSULE | Freq: Four times a day (QID) | ORAL | 0 refills | Status: AC

## 2022-03-08 NOTE — ED Provider Notes (Signed)
?[facility]GENC[facility] DEPARTMENT ?Provider Note ? ? ?CSN: [id number] ?Arrival date & time: 03/08/22  0945 ? ?  ? ?History ?PMH: fibromyalgia, anemia, hx of UTI, hx of gastric bypass ?Chief Complaint  ?Patient presents with  ? Flank Pain  ? ? ?Jennifer Harrell is a 56 y.o. female. ?Presents to the ED with a chief complaint of left-sided flank pain.  She says the symptoms started yesterday about 2 PM.  She says she noticed a slight discomfort in her left flank that wraps around to her left lower quadrant.  This pain has been constant.  It sometimes wax and wanes.  She also noticed around that time that she was having some pressure when she would urinate in her suprapubic region.  She denies any dysuria or hematuria.  She says she gets UTIs frequently and commonly has the bladder pressure but she does not normally have the back pain that goes with it.  She took [place] yesterday x2 with no relief.  Last urinary tract infection was back in the fall.  She denies any fevers, chills, nausea, vomiting, abdominal pain, diarrhea, vaginal discharge, vaginal bleeding. ? ? ?Flank Pain ?Associated symptoms include abdominal pain.  ? ?  ? ?Home Medications ?Prior to Admission medications   ?Medication Sig Start Date End Date Taking? Authorizing Provider  ?cephALEXin (KEFLEX) 250 MG capsule Take 1 capsule (250 mg total) by mouth 4 (four) times daily for 5 days. 03/08/22 03/13/22 Yes Sadie Pickar, Adora Fridge, PA-C  ?Multiple Vitamin (MULTIVITAMIN) tablet Take 1 tablet by mouth daily.    [provider]  ?Vitamin D, Ergocalciferol, (DRISDOL) 50000 UNITS CAPS capsule  10/27/13   [provider]  ?   ? ?Allergies    ?Ciprofloxacin, Prednisone, Sulfa antibiotics, and Sulfonamide derivatives   ? ?Review of Systems   ?Review of Systems  ?Constitutional:  Negative for chills and fever.  ?Gastrointestinal:  Positive for abdominal pain. Negative for nausea and vomiting.  ?Genitourinary:  Positive for flank pain. Negative for  dysuria.  ?All other systems reviewed and are negative. ? ?Physical Exam ?Updated Vital Signs ?BP 113/65   Pulse (!) 52   Temp 98.2 ?F (36.8 ?C) (Oral)   Resp 18   SpO2 99%  ?Physical Exam ?Vitals and nursing note reviewed.  ?Constitutional:   ?   General: She is not in acute distress. ?   Appearance: Normal appearance. She is well-developed. She is not ill-appearing, toxic-appearing or diaphoretic.  ?HENT:  ?   Head: Normocephalic and atraumatic.  ?   Nose: No nasal deformity.  ?   Mouth/Throat:  ?   Lips: Pink. No lesions.  ?Eyes:  ?   General: Gaze aligned appropriately. No scleral icterus.    ?   Right eye: No discharge.     ?   Left eye: No discharge.  ?   Conjunctiva/sclera: Conjunctivae normal.  ?   Right eye: Right conjunctiva is not injected. No exudate or hemorrhage. ?   Left eye: Left conjunctiva is not injected. No exudate or hemorrhage. ?Pulmonary:  ?   Effort: Pulmonary effort is normal. No respiratory distress.  ?Abdominal:  ?   General: Abdomen is flat. There is no distension.  ?   Palpations: Abdomen is soft. There is no mass.  ?   Tenderness: There is abdominal tenderness in the suprapubic area and left lower quadrant. There is left CVA tenderness. There is no right CVA tenderness, guarding or rebound.  ?   Hernia: No hernia is  present.  ?Skin: ?   General: Skin is warm and dry.  ?Neurological:  ?   Mental Status: She is alert and oriented to person, place, and time.  ?Psychiatric:     ?   Mood and Affect: Mood normal.     ?   Speech: Speech normal.     ?   Behavior: Behavior normal. Behavior is cooperative.  ? ? ?ED Results / Procedures / Treatments   ?Labs ?(all labs ordered are listed, but only abnormal results are displayed) ?Labs Reviewed  ?URINALYSIS, ROUTINE W REFLEX MICROSCOPIC - Abnormal; Notable for the following components:  ?    Result Value  ? Color, Urine GREEN (*)   ? APPearance HAZY (*)   ? Hgb urine dipstick SMALL (*)   ? Leukocytes,Ua MODERATE (*)   ? All other components  within normal limits  ?URINALYSIS, MICROSCOPIC (REFLEX) - Abnormal; Notable for the following components:  ? Bacteria, UA FEW (*)   ? All other components within normal limits  ?BASIC METABOLIC PANEL - Abnormal; Notable for the following components:  ? Calcium 8.3 (*)   ? All other components within normal limits  ?URINE CULTURE  ?CBC WITH DIFFERENTIAL/PLATELET  ? ? ?EKG ?None ? ?Radiology ?CT Renal Stone Study ? ?Result Date: 03/08/2022 ?CLINICAL DATA:  Left-sided flank pain, kidney stones suspected EXAM: CT ABDOMEN AND PELVIS WITHOUT CONTRAST TECHNIQUE: Multidetector CT imaging of the abdomen and pelvis was performed following the standard protocol without IV contrast. RADIATION DOSE REDUCTION: This exam was performed according to the departmental dose-optimization program which includes automated exposure control, adjustment of the mA and/or kV according to patient size and/or use of iterative reconstruction technique. COMPARISON:  01/29/2017 FINDINGS: Lower chest: No acute abnormality.  Small hiatal hernia. Hepatobiliary: No focal liver abnormality is seen. Status post cholecystectomy. No biliary dilatation. Pancreas: Unremarkable. No pancreatic ductal dilatation or surrounding inflammatory changes. Spleen: Normal in size without significant abnormality. Adrenals/Urinary Tract: Adrenal glands are unremarkable. Kidneys are normal, without renal calculi, solid lesion, or hydronephrosis. Bladder is unremarkable. Stomach/Bowel: Status post partial sleeve gastrectomy. Appendix is not clearly visualized. No evidence of bowel wall thickening, distention, or inflammatory changes. Vascular/Lymphatic: No significant vascular findings are present. No enlarged abdominal or pelvic lymph nodes. Reproductive: No mass or other significant abnormality. Other: No abdominal wall hernia or abnormality. No ascites. Musculoskeletal: No acute or significant osseous findings. IMPRESSION: 1. No acute noncontrast CT findings of the abdomen  or pelvis to explain left-sided flank pain. No urinary tract calculi or hydronephrosis. 2. Status post partial sleeve gastrectomy and cholecystectomy. Electronically Signed   By: Delanna Ahmadi M.D.   On: 03/08/2022 10:42   ? ?Procedures ?Procedures  ? ? ?Medications Ordered in ED ?Medications - No data to display ? ?ED Course/ Medical Decision Making/ A&P ?  ?                        ?Medical Decision Making ?Amount and/or Complexity of Data Reviewed ?Labs: ordered. ?Radiology: ordered. ? ?Risk ?Prescription drug management. ? ? ? ?MDM  ?This is a 56 y.o. female who presents to the ED with left flank pain ?The differential of this patient includes but is not limited to UTI, urolithiasis, diverticulitis, ovarian torsion, ovarian cyst, PID ? ?My Impression, Plan, and ED Course: Is a well-appearing.  She appears fairly comfortable.  56 year old female with a chief complaint of left flank pain.  Flank pain started gradually and is associated with some suprapubic  bladder pressure but this patient usually associates with urinary tract infections.  She has not had any other infectious or systemic symptoms.  Since this is different than her typical UTI presentation, will order stone study to evaluate for stones/hydronephrosis.  I doubt this is diverticulitis with no GI symptoms.  She is postmenopausal and without vaginal discharge so is unlikely to be ovarian etiology or PID. ?Abdominal exam is fairly reassuring.  There is no guarding present on exam but it was reproducible in the left lower quadrant and left flank. ? ?I personally ordered, reviewed, and interpreted all laboratory work and imaging and agree with radiologist interpretation. Results interpreted below: CBC is without leukocytosis, anemia, or other abnormality.  BMP is without kidney dysfunction, electrolyte abnormality.  Urinalysis shows moderate leukocytes with few bacteria.  There is 21-50 white blood cells.  Small amount of blood noted.  CT stone study shows  no evidence of urolithiasis, nephrolithiasis, hydronephrosis, bladder wall thickening, diverticulitis, ovarian problem, or other concerning abnormality. ? ?Since patient is having symptoms of urinary tract

## 2022-03-08 NOTE — ED Notes (Signed)
Spoke to [place] in lab who will add urine culture to previously collected urine. ?

## 2022-03-08 NOTE — ED Triage Notes (Signed)
Left flank pain with LLQ pain with dysuria since yesterday  ?

## 2022-03-08 NOTE — Discharge Instructions (Addendum)
Your labs and CT were reassuring. Your urine and presentation are consistent with a urinary tract infection. I have prescribed you a 5 day course of antibiotics. You have a pending urine culture that has been sent off. We will call you if you need to change therapy or your PCP can follow up on this.  Please take this as prescribed and complete course. If symptoms worsen or do not improve you can follow up here or return to PCP. ?

## 2022-03-08 NOTE — ED Notes (Signed)
Patient transported to CT 

## 2022-03-10 LAB — URINE CULTURE: Culture: 50000 — AB

## 2022-03-11 ENCOUNTER — Telehealth: Payer: Self-pay | Admitting: *Deleted

## 2022-03-11 NOTE — Telephone Encounter (Signed)
Post ED Visit - Positive Culture Follow-up ? ?Culture report reviewed by antimicrobial stewardship pharmacist: ?[facility] ?'[]'$  Elenor Quinones, Pharm.D. ?'[]'$  Heide Guile, Pharm.D., BCPS AQ-ID ?'[]'$  Parks Neptune, Pharm.D., BCPS ?'[]'$  Alycia Rossetti, Pharm.D., BCPS ?'[]'$  [place], Pharm.D., BCPS, AAHIVP ?'[]'$  Legrand Como, Pharm.D., BCPS, AAHIVP ?'[]'$  Salome Arnt, PharmD, BCPS ?'[]'$  Johnnette Gourd, PharmD, BCPS ?'[]'$  Hughes Better, PharmD, BCPS ?'[]'$  Leeroy Cha, PharmD ?'[]'$  Laqueta Linden, PharmD, BCPS ?'[]'$  Albertina Parr, PharmD ? ?[facility] ?'[]'$  Leodis Sias, PharmD ?'[]'$  Lindell Spar, PharmD ?'[]'$  Royetta Asal, PharmD ?'[]'$  Graylin Shiver, Rph ?'[]'$  Rema Fendt) Glennon Mac, PharmD ?'[]'$  Arlyn Dunning, PharmD ?'[]'$  Netta Cedars, PharmD ?'[]'$  Dia Sitter, PharmD ?'[]'$  Leone Haven, PharmD ?'[]'$  Gretta Arab, PharmD ?'[]'$  Theodis Shove, PharmD ?'[]'$  Peggyann Juba, PharmD ?'[]'$  Reuel Boom, PharmD ? ? ?Positive urine culture ?Treated with Cephalexin, organism sensitive to the same and no further patient follow-up is required at this time.  Reatha Harps, PharmD ? ?Ardeen Fillers ?03/11/2022, 10:05 AM ?  ?

## 2022-12-08 ENCOUNTER — Other Ambulatory Visit (HOSPITAL_COMMUNITY): Payer: Self-pay

## 2022-12-08 ENCOUNTER — Encounter: Payer: Self-pay | Admitting: Hematology

## 2022-12-08 MED ORDER — WEGOVY 0.25 MG/0.5ML ~~LOC~~ SOAJ
0.2500 mg | SUBCUTANEOUS | 0 refills | Status: AC
  Filled 2022-12-08: qty 2, 30d supply, fill #0
  Filled 2023-01-18: qty 2, 28d supply, fill #0

## 2022-12-08 MED ORDER — WEGOVY 0.5 MG/0.5ML ~~LOC~~ SOAJ
0.5000 mg | SUBCUTANEOUS | 0 refills | Status: AC
  Filled 2022-12-08: qty 2, 30d supply, fill #0
  Filled 2023-01-18: qty 2, 28d supply, fill #0

## 2022-12-08 MED ORDER — WEGOVY 1 MG/0.5ML ~~LOC~~ SOAJ
1.0000 mg | SUBCUTANEOUS | 0 refills | Status: AC
  Filled 2022-12-08: qty 2, 30d supply, fill #0
  Filled 2023-01-18: qty 2, 28d supply, fill #0

## 2023-01-18 ENCOUNTER — Other Ambulatory Visit (HOSPITAL_COMMUNITY): Payer: Self-pay

## 2023-01-28 ENCOUNTER — Other Ambulatory Visit (HOSPITAL_COMMUNITY): Payer: Self-pay

## 2023-01-28 ENCOUNTER — Other Ambulatory Visit: Payer: Self-pay

## 2023-01-28 ENCOUNTER — Encounter: Payer: Self-pay | Admitting: Hematology

## 2023-02-04 ENCOUNTER — Other Ambulatory Visit (HOSPITAL_COMMUNITY): Payer: Self-pay

## 2023-03-17 ENCOUNTER — Encounter: Payer: Self-pay | Admitting: Hematology

## 2023-03-17 ENCOUNTER — Other Ambulatory Visit: Payer: Self-pay | Admitting: Nurse Practitioner

## 2023-03-17 ENCOUNTER — Ambulatory Visit
Admission: RE | Admit: 2023-03-17 | Discharge: 2023-03-17 | Disposition: A | Discharge status: Home / Self Care | Payer: No Typology Code available for payment source | Source: Ambulatory Visit | Attending: Nurse Practitioner | Admitting: Nurse Practitioner

## 2023-03-17 DIAGNOSIS — S99922A Unspecified injury of left foot, initial encounter: Secondary | ICD-10-CM

## 2023-03-18 ENCOUNTER — Other Ambulatory Visit (HOSPITAL_COMMUNITY): Payer: Self-pay

## 2023-03-18 MED ORDER — WEGOVY 1.7 MG/0.75ML ~~LOC~~ SOAJ
SUBCUTANEOUS | 0 refills | Status: DC
Start: 2023-03-18 — End: 2023-05-06
  Filled 2023-03-18 – 2023-03-26 (×2): qty 3, 28d supply, fill #0

## 2023-03-26 ENCOUNTER — Other Ambulatory Visit (HOSPITAL_COMMUNITY): Payer: Self-pay

## 2023-05-06 ENCOUNTER — Encounter: Payer: Self-pay | Admitting: Hematology

## 2023-05-06 ENCOUNTER — Other Ambulatory Visit (HOSPITAL_COMMUNITY): Payer: Self-pay

## 2023-05-06 MED ORDER — WEGOVY 1.7 MG/0.75ML ~~LOC~~ SOAJ
1.7000 mg | SUBCUTANEOUS | 0 refills | Status: DC
  Filled 2023-05-06 – 2023-05-24 (×2): qty 3, 28d supply, fill #0

## 2023-05-08 ENCOUNTER — Other Ambulatory Visit (HOSPITAL_COMMUNITY): Payer: Self-pay

## 2023-05-11 ENCOUNTER — Other Ambulatory Visit (HOSPITAL_COMMUNITY): Payer: Self-pay

## 2023-05-14 ENCOUNTER — Other Ambulatory Visit (HOSPITAL_COMMUNITY): Payer: Self-pay

## 2023-05-21 ENCOUNTER — Other Ambulatory Visit (HOSPITAL_COMMUNITY): Payer: Self-pay

## 2023-05-24 ENCOUNTER — Other Ambulatory Visit (HOSPITAL_COMMUNITY): Payer: Self-pay

## 2023-05-27 ENCOUNTER — Other Ambulatory Visit (HOSPITAL_COMMUNITY): Payer: Self-pay

## 2023-05-27 MED ORDER — WEGOVY 1.7 MG/0.75ML ~~LOC~~ SOAJ
1.7000 mg | SUBCUTANEOUS | 0 refills | Status: DC
Start: 2023-05-27 — End: 2023-09-13
  Filled 2023-09-13: qty 3, 28d supply, fill #0

## 2023-06-17 ENCOUNTER — Other Ambulatory Visit (HOSPITAL_COMMUNITY): Payer: Self-pay

## 2023-06-17 MED ORDER — WEGOVY 1.7 MG/0.75ML ~~LOC~~ SOAJ
1.7000 mg | SUBCUTANEOUS | 2 refills | Status: AC
  Filled 2023-06-17: qty 3, 28d supply, fill #0
  Filled 2023-07-19 – 2023-08-11 (×2): qty 3, 28d supply, fill #1

## 2023-07-15 ENCOUNTER — Other Ambulatory Visit (HOSPITAL_COMMUNITY): Payer: Self-pay

## 2023-07-15 MED ORDER — WEGOVY 2.4 MG/0.75ML ~~LOC~~ SOAJ
2.4000 mg | SUBCUTANEOUS | 2 refills | Status: AC
  Filled 2023-07-15: qty 3, 28d supply, fill #0
  Filled 2023-11-03: qty 3, 28d supply, fill #1

## 2023-07-19 ENCOUNTER — Other Ambulatory Visit (HOSPITAL_COMMUNITY): Payer: Self-pay

## 2023-07-26 ENCOUNTER — Other Ambulatory Visit: Payer: Self-pay | Admitting: Internal Medicine

## 2023-07-26 DIAGNOSIS — M8588 Other specified disorders of bone density and structure, other site: Secondary | ICD-10-CM

## 2023-08-19 ENCOUNTER — Other Ambulatory Visit (HOSPITAL_COMMUNITY): Payer: Self-pay

## 2023-08-19 MED ORDER — WEGOVY 2.4 MG/0.75ML ~~LOC~~ SOAJ
2.4000 mg | SUBCUTANEOUS | 2 refills | Status: AC
  Filled 2023-08-19 – 2024-01-31 (×2): qty 3, 28d supply, fill #0

## 2023-08-26 IMAGING — CT CT RENAL STONE PROTOCOL
2 of 4 series · 16 of 46 positions shown, 18 images · non-contrast
Comparison: 01/29/2017

CLINICAL DATA: Left-sided flank pain, kidney stones suspected



[Series 2: axial st · axial · 0.74mm/px · z∈[-400,+24]mm · 13 of 93 slices shown, 15 images]
[im 4/93  soft-tissue]
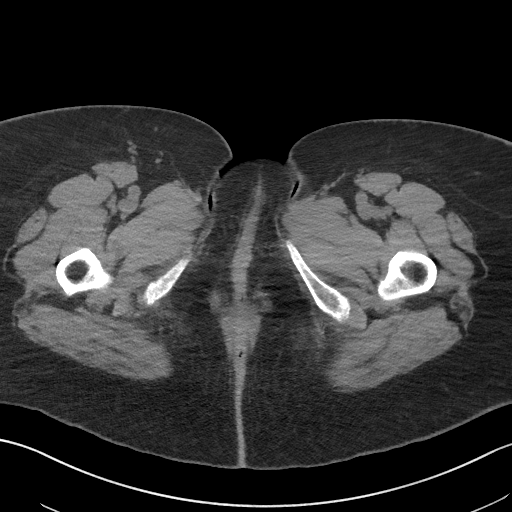
[im 4/93  bone]
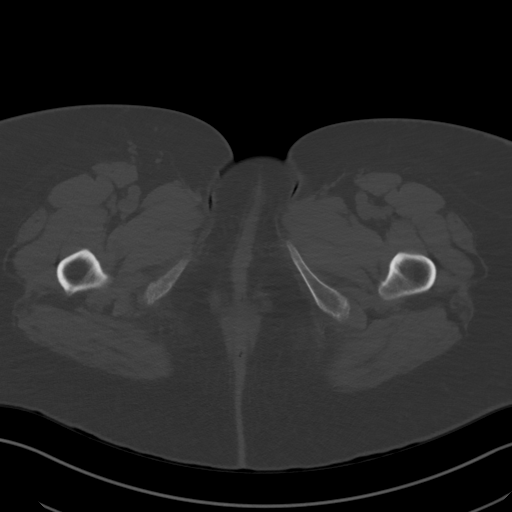
[im 12/93  soft-tissue]
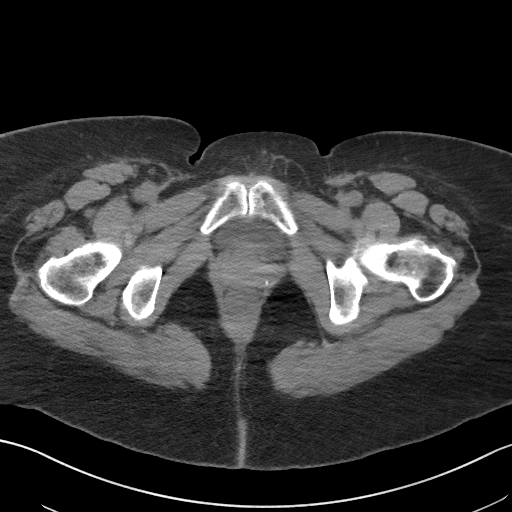
[im 20/93  soft-tissue]
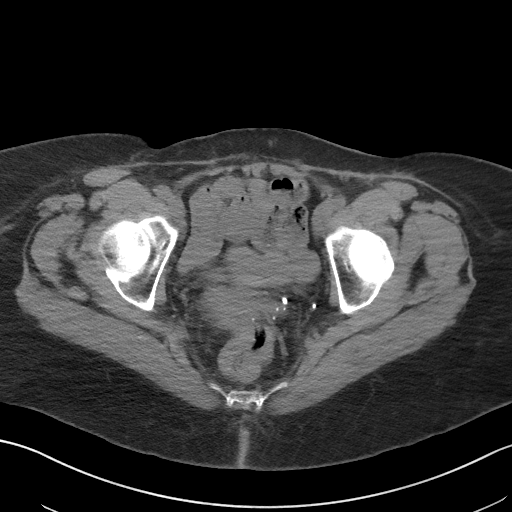
[im 27/93  soft-tissue]
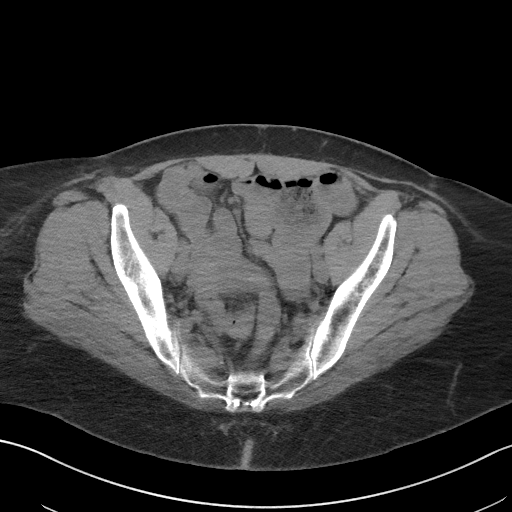
[im 31/93  soft-tissue]
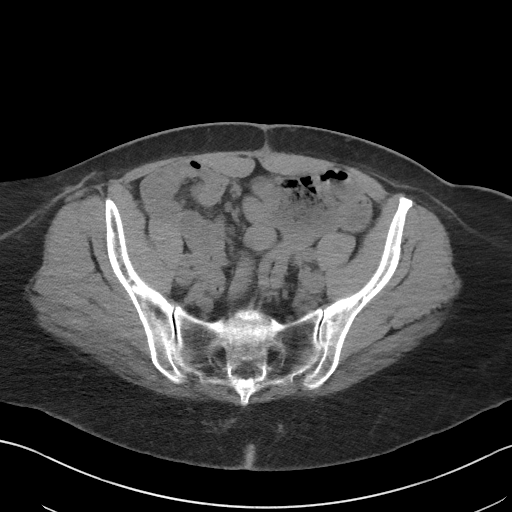
[im 39/93  soft-tissue]
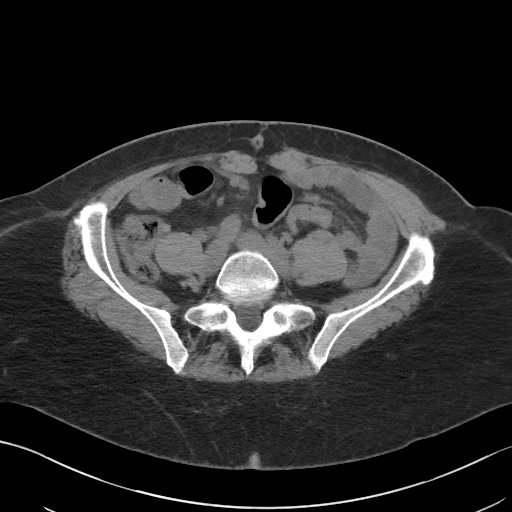
[im 47/93  soft-tissue]
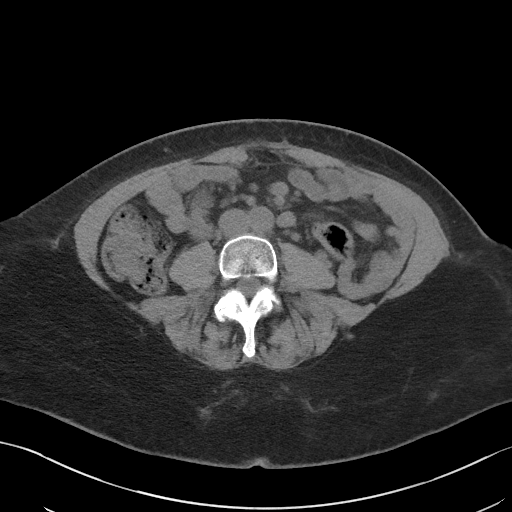
[im 54/93  soft-tissue]
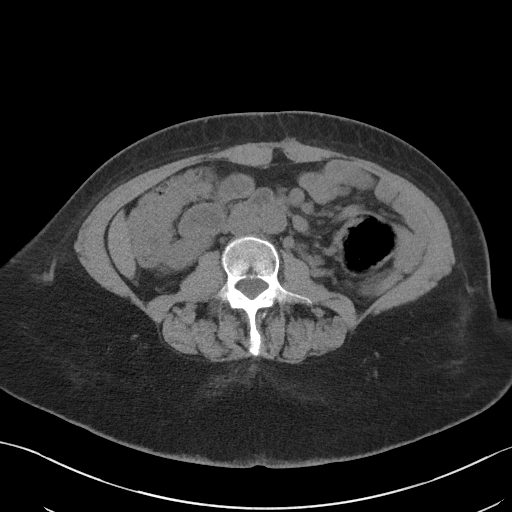
[im 62/93  soft-tissue]
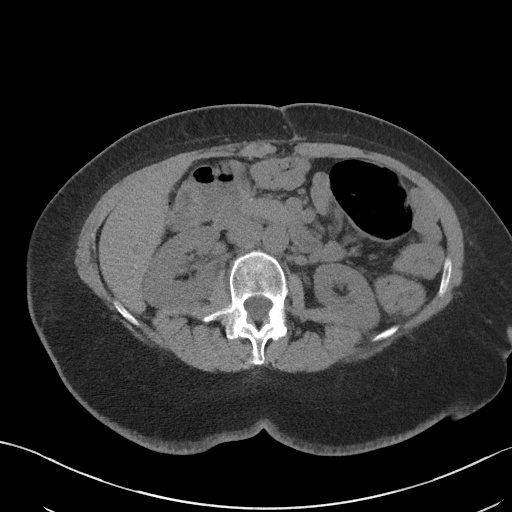
[im 62/93  bone]
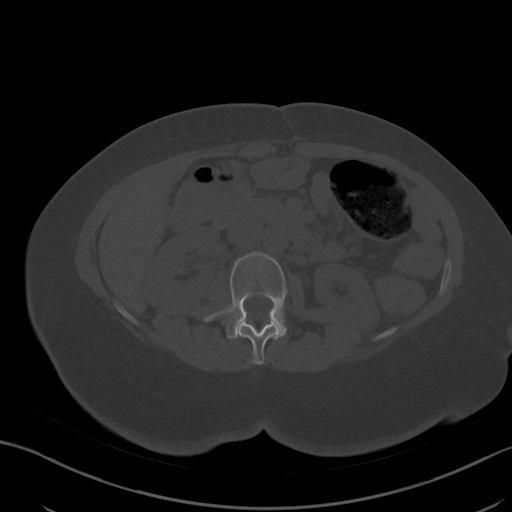
[im 66/93  soft-tissue]
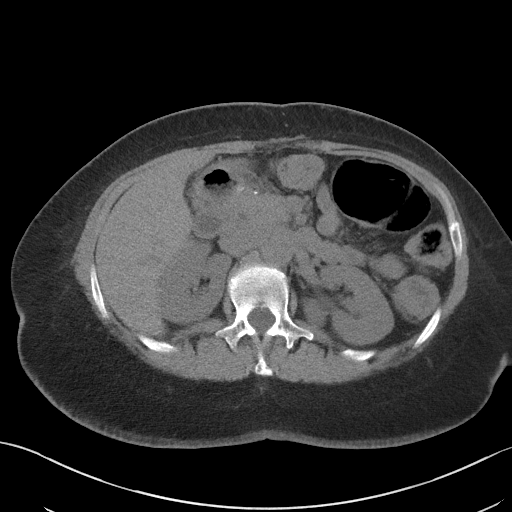
[im 73/93  soft-tissue]
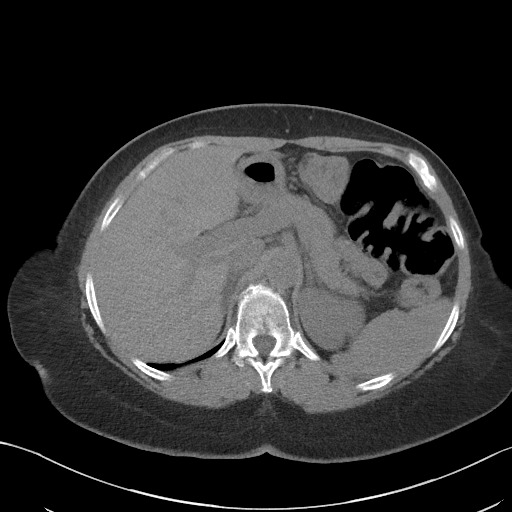
[im 81/93  soft-tissue]
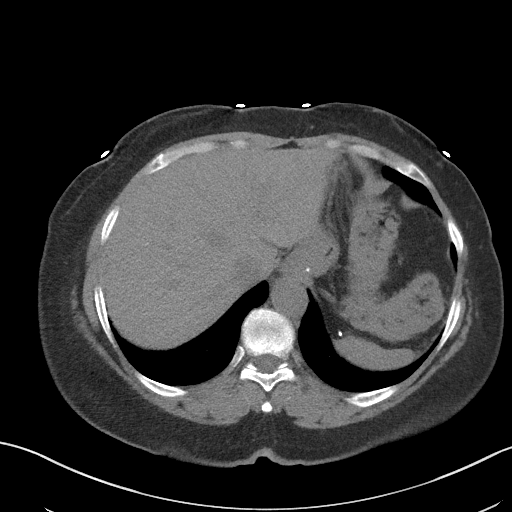
[im 89/93  soft-tissue]
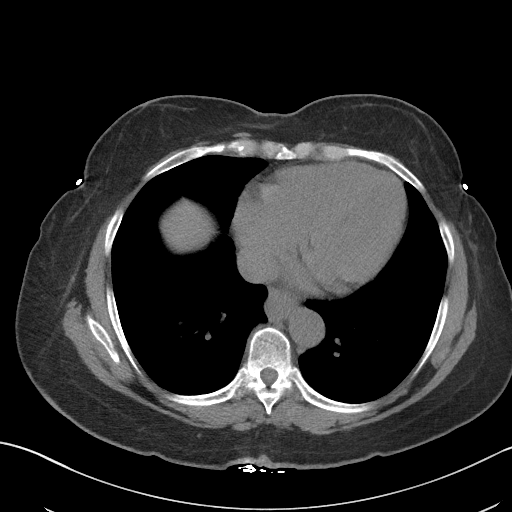

[Series 5: coronal st · coronal · 0.79mm/px · 3 of 109 slices shown]
[im 37/109  soft-tissue]
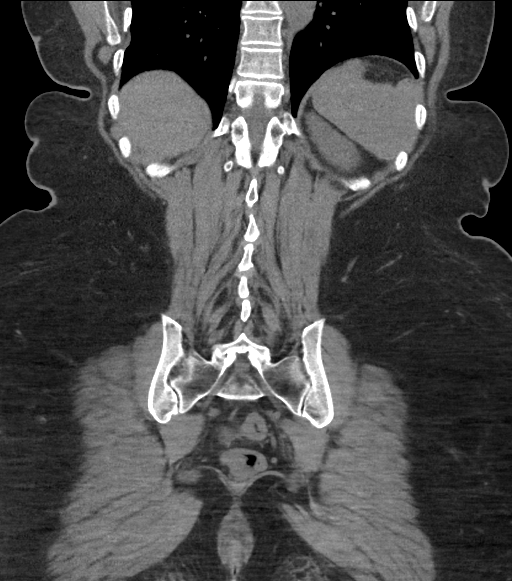
[im 49/109  soft-tissue]
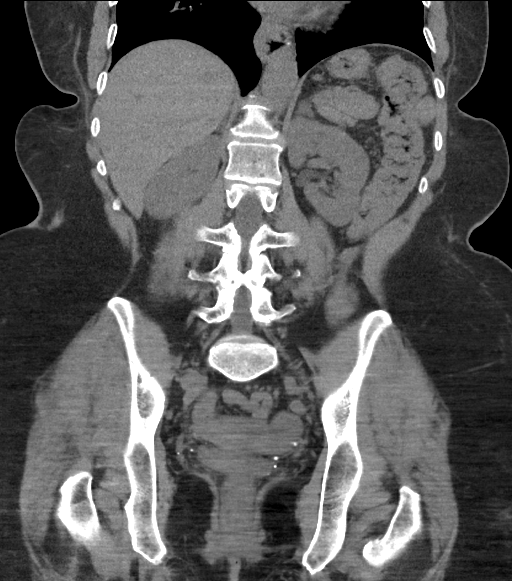
[im 61/109  soft-tissue]
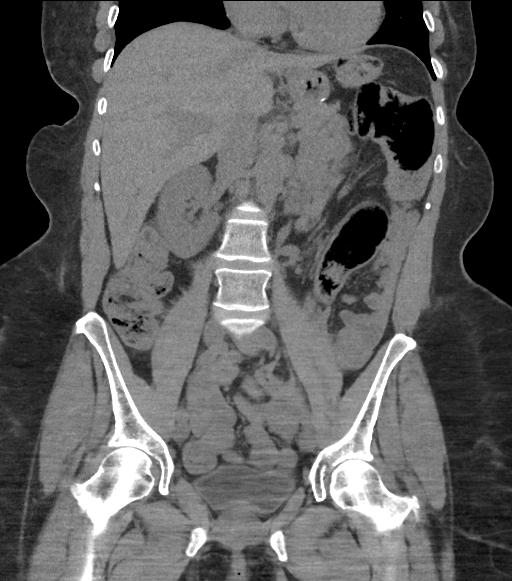

[16 of 46 positions shown; findings below may reference images not displayed]

FINDINGS: Lower chest: No acute abnormality.  Small hiatal hernia.

Hepatobiliary: No focal liver abnormality is seen. Status post
cholecystectomy. No biliary dilatation.

Pancreas: Unremarkable. No pancreatic ductal dilatation or
surrounding inflammatory changes.

Spleen: Normal in size without significant abnormality.

Adrenals/Urinary Tract: Adrenal glands are unremarkable. Kidneys are
normal, without renal calculi, solid lesion, or hydronephrosis.
Bladder is unremarkable.

Stomach/Bowel: Status post partial sleeve gastrectomy. Appendix is
not clearly visualized. No evidence of bowel wall thickening,
distention, or inflammatory changes.

Vascular/Lymphatic: No significant vascular findings are present. No
enlarged abdominal or pelvic lymph nodes.

Reproductive: No mass or other significant abnormality.

Other: No abdominal wall hernia or abnormality. No ascites.

Musculoskeletal: No acute or significant osseous findings.
IMPRESSION: 1. No acute noncontrast CT findings of the abdomen or pelvis to
explain left-sided flank pain. No urinary tract calculi or
hydronephrosis.
2. Status post partial sleeve gastrectomy and cholecystectomy.

## 2023-09-10 ENCOUNTER — Other Ambulatory Visit (HOSPITAL_COMMUNITY): Payer: Self-pay

## 2023-09-13 ENCOUNTER — Other Ambulatory Visit (HOSPITAL_COMMUNITY): Payer: Self-pay

## 2023-09-13 MED ORDER — WEGOVY 1.7 MG/0.75ML ~~LOC~~ SOAJ
1.7000 mg | SUBCUTANEOUS | 2 refills | Status: AC
  Filled 2023-09-13: qty 2.25, 21d supply, fill #0
  Filled 2023-10-06: qty 3, 28d supply, fill #0
  Filled 2024-06-28: qty 3, 28d supply, fill #1

## 2023-09-16 ENCOUNTER — Other Ambulatory Visit: Payer: Self-pay

## 2023-10-06 ENCOUNTER — Other Ambulatory Visit (HOSPITAL_COMMUNITY): Payer: Self-pay

## 2023-10-11 ENCOUNTER — Other Ambulatory Visit (HOSPITAL_COMMUNITY): Payer: Self-pay

## 2023-10-26 ENCOUNTER — Other Ambulatory Visit (HOSPITAL_COMMUNITY): Payer: Self-pay

## 2023-10-26 MED ORDER — WEGOVY 2.4 MG/0.75ML ~~LOC~~ SOAJ
2.4000 mg | SUBCUTANEOUS | 1 refills | Status: AC
  Filled 2023-10-26: qty 3, 28d supply, fill #0
  Filled 2024-04-11: qty 3, 28d supply, fill #1

## 2023-11-26 ENCOUNTER — Other Ambulatory Visit (HOSPITAL_COMMUNITY): Payer: Self-pay

## 2023-11-29 ENCOUNTER — Other Ambulatory Visit (HOSPITAL_COMMUNITY): Payer: Self-pay

## 2023-12-06 ENCOUNTER — Other Ambulatory Visit (HOSPITAL_BASED_OUTPATIENT_CLINIC_OR_DEPARTMENT_OTHER): Payer: Self-pay

## 2023-12-06 ENCOUNTER — Other Ambulatory Visit (HOSPITAL_COMMUNITY): Payer: Self-pay

## 2023-12-06 MED ORDER — AMOXICILLIN-POT CLAVULANATE 875-125 MG PO TABS
1.0000 | ORAL_TABLET | Freq: Two times a day (BID) | ORAL | 0 refills | Status: AC
  Filled 2023-12-06: qty 20, 10d supply, fill #0

## 2023-12-16 ENCOUNTER — Other Ambulatory Visit (HOSPITAL_COMMUNITY): Payer: Self-pay

## 2023-12-16 MED ORDER — WEGOVY 2.4 MG/0.75ML ~~LOC~~ SOAJ
2.4000 mg | SUBCUTANEOUS | 3 refills | Status: AC
  Filled 2023-12-16 – 2023-12-17 (×2): qty 3, 28d supply, fill #0
  Filled 2024-02-28: qty 3, 28d supply, fill #1
  Filled 2024-03-21: qty 3, 28d supply, fill #2
  Filled 2024-04-11: qty 3, 28d supply, fill #3

## 2023-12-17 ENCOUNTER — Other Ambulatory Visit (HOSPITAL_COMMUNITY): Payer: Self-pay

## 2023-12-21 ENCOUNTER — Other Ambulatory Visit (HOSPITAL_COMMUNITY): Payer: Self-pay

## 2023-12-21 MED ORDER — CIPROFLOXACIN HCL 500 MG PO TABS
500.0000 mg | ORAL_TABLET | Freq: Two times a day (BID) | ORAL | 0 refills | Status: AC
  Filled 2023-12-21: qty 10, 5d supply, fill #0

## 2024-01-10 ENCOUNTER — Other Ambulatory Visit (HOSPITAL_COMMUNITY): Payer: Self-pay

## 2024-01-10 MED ORDER — ESTRADIOL 10 MCG VA TABS
10.0000 ug | ORAL_TABLET | VAGINAL | 4 refills | Status: AC
  Filled 2024-01-10: qty 8, 28d supply, fill #0
  Filled 2024-02-07: qty 8, 28d supply, fill #1
  Filled 2024-02-28: qty 8, 28d supply, fill #2
  Filled 2024-04-11: qty 8, 28d supply, fill #3
  Filled 2024-05-09: qty 8, 28d supply, fill #4
  Filled 2024-06-06: qty 8, 28d supply, fill #5
  Filled 2024-07-02: qty 8, 28d supply, fill #6
  Filled 2024-09-07: qty 8, 28d supply, fill #7
  Filled 2024-10-05: qty 8, 28d supply, fill #8
  Filled 2024-11-15: qty 8, 28d supply, fill #9
  Filled 2024-12-11: qty 8, 28d supply, fill #10

## 2024-01-31 ENCOUNTER — Other Ambulatory Visit (HOSPITAL_COMMUNITY): Payer: Self-pay

## 2024-02-15 ENCOUNTER — Other Ambulatory Visit (HOSPITAL_COMMUNITY): Payer: Self-pay

## 2024-02-15 MED ORDER — BUPROPION HCL ER (XL) 150 MG PO TB24
150.0000 mg | ORAL_TABLET | Freq: Every morning | ORAL | 1 refills | Status: DC
Start: 2024-02-15 — End: 2024-10-05
  Filled 2024-02-15 – 2024-02-16 (×2): qty 30, 30d supply, fill #0
  Filled 2024-05-09: qty 30, 30d supply, fill #1
  Filled 2024-06-06: qty 30, 30d supply, fill #2
  Filled 2024-07-03: qty 30, 30d supply, fill #3
  Filled 2024-07-30: qty 30, 30d supply, fill #4
  Filled 2024-09-04: qty 30, 30d supply, fill #5

## 2024-02-16 ENCOUNTER — Other Ambulatory Visit (HOSPITAL_COMMUNITY): Payer: Self-pay

## 2024-02-29 ENCOUNTER — Other Ambulatory Visit (HOSPITAL_COMMUNITY): Payer: Self-pay

## 2024-03-24 ENCOUNTER — Ambulatory Visit
Admission: RE | Admit: 2024-03-24 | Discharge: 2024-03-24 | Disposition: A | Discharge status: Home / Self Care | Payer: 59 | Source: Ambulatory Visit | Attending: Internal Medicine | Admitting: Internal Medicine

## 2024-03-24 DIAGNOSIS — M8588 Other specified disorders of bone density and structure, other site: Secondary | ICD-10-CM

## 2024-03-27 ENCOUNTER — Inpatient Hospital Stay: Attending: Hematology | Admitting: Hematology

## 2024-03-27 ENCOUNTER — Inpatient Hospital Stay

## 2024-03-27 VITALS — BP 120/72 | HR 68 | Temp 98.1°F | Resp 17 | Ht 67.0 in | Wt 206.4 lb

## 2024-03-27 DIAGNOSIS — E611 Iron deficiency: Secondary | ICD-10-CM | POA: Diagnosis present

## 2024-03-27 DIAGNOSIS — R7401 Elevation of levels of liver transaminase levels: Secondary | ICD-10-CM | POA: Diagnosis not present

## 2024-03-27 DIAGNOSIS — Z9884 Bariatric surgery status: Secondary | ICD-10-CM

## 2024-03-27 DIAGNOSIS — E559 Vitamin D deficiency, unspecified: Secondary | ICD-10-CM

## 2024-03-27 DIAGNOSIS — R7989 Other specified abnormal findings of blood chemistry: Secondary | ICD-10-CM | POA: Diagnosis not present

## 2024-03-27 DIAGNOSIS — E538 Deficiency of other specified B group vitamins: Secondary | ICD-10-CM | POA: Diagnosis not present

## 2024-03-27 DIAGNOSIS — D509 Iron deficiency anemia, unspecified: Secondary | ICD-10-CM

## 2024-03-27 LAB — CMP (CANCER CENTER ONLY)
ALT: 84 U/L — ABNORMAL HIGH (ref 0–44)
AST: 93 U/L — ABNORMAL HIGH (ref 15–41)
Albumin: 4.2 g/dL (ref 3.5–5.0)
Alkaline Phosphatase: 121 U/L (ref 38–126)
Anion gap: 6 (ref 5–15)
BUN: 10 mg/dL (ref 6–20)
CO2: 28 mmol/L (ref 22–32)
Calcium: 8.8 mg/dL — ABNORMAL LOW (ref 8.9–10.3)
Chloride: 108 mmol/L (ref 98–111)
Creatinine: 0.89 mg/dL (ref 0.44–1.00)
GFR, Estimated: >60 mL/min (ref >60)
Glucose, Bld: 80 mg/dL (ref 70–99)
Potassium: 4 mmol/L (ref 3.5–5.1)
Sodium: 142 mmol/L (ref 135–145)
Total Bilirubin: 1.5 mg/dL — ABNORMAL HIGH (ref 0.0–1.2)
Total Protein: 6.7 g/dL (ref 6.5–8.1)

## 2024-03-27 LAB — CBC WITH DIFFERENTIAL (CANCER CENTER ONLY)
Abs Immature Granulocytes: 0.01 10*3/uL (ref 0.00–0.07)
Basophils Absolute: 0 10*3/uL (ref 0.0–0.1)
Basophils Relative: 1 %
Eosinophils Absolute: 0.1 10*3/uL (ref 0.0–0.5)
Eosinophils Relative: 1 %
HCT: 38.6 % (ref 36.0–46.0)
Hemoglobin: 12.2 g/dL (ref 12.0–15.0)
Immature Granulocytes: 0 %
Lymphocytes Relative: 26 %
Lymphs Abs: 1.3 10*3/uL (ref 0.7–4.0)
MCH: 29.5 pg (ref 26.0–34.0)
MCHC: 31.6 g/dL (ref 30.0–36.0)
MCV: 93.2 fL (ref 80.0–100.0)
Monocytes Absolute: 0.6 10*3/uL (ref 0.1–1.0)
Monocytes Relative: 11 %
Neutro Abs: 3.1 10*3/uL (ref 1.7–7.7)
Neutrophils Relative %: 61 %
Platelet Count: 173 10*3/uL (ref 150–400)
RBC: 4.14 MIL/uL (ref 3.87–5.11)
RDW: 13.2 % (ref 11.5–15.5)
WBC Count: 5.1 10*3/uL (ref 4.0–10.5)
nRBC: 0 % (ref 0.0–0.2)

## 2024-03-27 LAB — IRON AND IRON BINDING CAPACITY (CC-WL,HP ONLY)
Iron: 106 ug/dL (ref 28–170)
Saturation Ratios: 23 % (ref 10.4–31.8)
TIBC: 456 ug/dL — ABNORMAL HIGH (ref 250–450)
UIBC: 350 ug/dL (ref 148–442)

## 2024-03-27 LAB — VITAMIN B12: Vitamin B-12: 918 pg/mL — ABNORMAL HIGH (ref 180–914)

## 2024-03-27 LAB — FERRITIN: Ferritin: 14 ng/mL (ref 11–307)

## 2024-03-27 NOTE — Progress Notes (Signed)
 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 female who has been referred to us  by Dr. 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   Labs from 02/04/2024: CBC showed low Hgb of 11.7 g/dL, lab range from 40.9-81.1 g/dL, low Hct of 91.4%, lab range from 37.0-47.0%. CMP stable. Low ferritin level of 5.6 ng/mL with lab range from 11.0 to 306.8 ng/mL. B-12 level of 619.    Patient was previously seen by us , with last visit on 03/06/2016, for Iron Deficiency Anemia. She was treated with IV Feraheme .  Patient notes she has been doing well overall since our last visit. She has been doing well overall in the past 87-months. She does complain of fatigue and pagophagia.   She is currently on Wegovy  2.4 mg weekly. She takes Vitamin B-12 and Vitamin-D supplement.   She denies any new infection issues, fever, chills, night sweats, unexpected weight loss, back pain, chest pain, abdominal pain, or leg swelling.   She denies Zinc  or copper deficiency.   MEDICAL HISTORY:  Past Medical History:  Diagnosis Date   Anemia    Fibromyalgia    History of breast surgery 1983   reduction   Vitamin D  deficiency     SURGICAL HISTORY: Past Surgical History:  Procedure Laterality Date   ABDOMINOPLASTY  2005   BPDD weight loss surgery  2002   breast reduction     THIGH LIFT     uterine ablation      SOCIAL HISTORY: Social History   Socioeconomic History   Marital status: Married    Spouse name: Not on file   Number of children: Not on file   Years of education: Not on file   Highest education level: Not on file   Occupational History   Occupation: Cytogeneticist  Tobacco Use   Smoking status: Never   Smokeless tobacco: Never  Substance and Sexual Activity   Alcohol use: No    Alcohol/week: 0.0 standard drinks of alcohol   Drug use: No   Sexual activity: Not on file  Other Topics Concern   Not on file  Social History Narrative   Not on file   Social Drivers of Health   Financial Resource Strain: Not on file  Food Insecurity: Low Risk  (12/06/2023)   Received from [facility]   Hunger Vital Sign    Worried About Running Out of Food in the Last Year: Never true    Ran Out of Food in the Last Year: Never true  Transportation Needs: No Transportation Needs (12/06/2023)   Received from [facility]    In the past 12 months, has lack of reliable transportation kept you from medical appointments, meetings, work or from getting things needed for daily living? : No  Physical Activity: Not on file  Stress: Not on file  Social Connections: Not on file  Intimate Partner Violence: Not on file    FAMILY HISTORY: Family History  Problem Relation Age of Onset   Cancer Maternal Grandfather        stomach   Diabetes Maternal Grandfather    Heart failure Maternal Grandfather  Diabetes Mother    Heart failure Maternal Grandmother     ALLERGIES:  is allergic to ciprofloxacin , prednisone, sulfa antibiotics, and sulfonamide derivatives.  MEDICATIONS:  Current Outpatient Medications  Medication Sig Dispense Refill   amoxicillin -clavulanate (AUGMENTIN ) 875-125 MG tablet Take 1 tablet by mouth 2 (two) times a day for 10 days. 20 tablet 0   buPROPion  (WELLBUTRIN  XL) 150 MG 24 hr tablet Take 1 tablet (150 mg total) by mouth every morning for 90 days. 90 tablet 1   ciprofloxacin  (CIPRO ) 500 MG tablet Take 1 tablet (500 mg total) by mouth every 12 (twelve) hours for 5 days 10 tablet 0   Estradiol  10 MCG TABS vaginal tablet Insert 1 tablet vaginally at bedtime twice a week as  directed. 24 tablet 4   Multiple Vitamin (MULTIVITAMIN) tablet Take 1 tablet by mouth daily.     Semaglutide -Weight Management (WEGOVY ) 0.25 MG/0.5ML SOAJ Inject 0.25 mg into the skin once a week. 2 mL 0   Semaglutide -Weight Management (WEGOVY ) 0.5 MG/0.5ML SOAJ Inject 0.5 mg into the skin once a week. 2 mL 0   Semaglutide -Weight Management (WEGOVY ) 1 MG/0.5ML SOAJ Inject 1 mg into the skin once a week. 2 mL 0   Semaglutide -Weight Management (WEGOVY ) 1.7 MG/0.75ML SOAJ Inject 1.7 mg into the skin once a week. 2 mL 2   Semaglutide -Weight Management (WEGOVY ) 1.7 MG/0.75ML SOAJ Inject 1.7 mg into the skin once a week. 3 mL 2   Semaglutide -Weight Management (WEGOVY ) 2.4 MG/0.75ML SOAJ Inject 2.4 mg into the skin once a week. 2 mL 2   Semaglutide -Weight Management (WEGOVY ) 2.4 MG/0.75ML SOAJ Inject 2.4 mg into the skin once a week. 3 mL 2   Semaglutide -Weight Management (WEGOVY ) 2.4 MG/0.75ML SOAJ Inject 2.4 mg into the skin once a week. 2 mL 1   Semaglutide -Weight Management (WEGOVY ) 2.4 MG/0.75ML SOAJ Inject 2.4 mg into the skin once a week. 3 mL 3   Vitamin D , Ergocalciferol , (DRISDOL) 50000 UNITS CAPS capsule      No current facility-administered medications for this visit.    REVIEW OF SYSTEMS:    10 Point review of Systems was done is negative except as noted above.  PHYSICAL EXAMINATION: ECOG PERFORMANCE STATUS: 1 - Symptomatic but completely ambulatory  . Vitals:   03/27/24 1059  BP: 120/72  Pulse: 68  Resp: 17  Temp: 98.1 F (36.7 C)  SpO2: 100%   Filed Weights   03/27/24 1059  Weight: 206 lb 6.4 oz (93.6 kg)   .Body mass index is 32.33 kg/m.  GENERAL:alert, in no acute distress and comfortable SKIN: no acute rashes, no significant lesions EYES: conjunctiva are pink and non-injected, sclera anicteric OROPHARYNX: MMM, no exudates, no oropharyngeal erythema or ulceration NECK: supple, no JVD LYMPH:  no palpable lymphadenopathy in the cervical, axillary or inguinal  regions LUNGS: clear to auscultation b/l with normal respiratory effort HEART: regular rate & rhythm ABDOMEN:  normoactive bowel sounds , non tender, not distended. Extremity: no pedal edema PSYCH: alert & oriented x 3 with fluent speech NEURO: no focal motor/sensory deficits  LABORATORY DATA:  I have reviewed the data as listed  .    Latest Ref Rng & Units 03/27/2024   12:14 PM 03/08/2022   10:38 AM 12/08/2021    8:23 AM  CBC  WBC 4.0 - 10.5 K/uL 5.1  6.4  5.5   Hemoglobin 12.0 - 15.0 g/dL 54.0  98.1  19.1   Hematocrit 36.0 - 46.0 % 38.6  39.2  42.1   Platelets  150 - 400 K/uL 173  198  199     .    Latest Ref Rng & Units 03/27/2024   12:14 PM 03/08/2022   10:38 AM 12/08/2021    8:23 AM  CMP  Glucose 70 - 99 mg/dL 80  87  88   BUN 6 - 20 mg/dL 10  10  15    Creatinine 0.44 - 1.00 mg/dL 1.91  4.78  2.95   Sodium 135 - 145 mmol/L 142  142  139   Potassium 3.5 - 5.1 mmol/L 4.0  3.7  3.8   Chloride 98 - 111 mmol/L 108  110  107   CO2 22 - 32 mmol/L 28  26  22    Calcium 8.9 - 10.3 mg/dL 8.8  8.3  9.5   Total Protein 6.5 - 8.1 g/dL 6.7     Total Bilirubin 0.0 - 1.2 mg/dL 1.5     Alkaline Phos 38 - 126 U/L 121     AST 15 - 41 U/L 93     ALT 0 - 44 U/L 84        RADIOGRAPHIC STUDIES: I have personally reviewed the radiological images as listed and agreed with the findings in the report. DG BONE DENSITY (DXA) Result Date: 03/24/2024 EXAM: DUAL X-RAY ABSORPTIOMETRY (DXA) FOR BONE MINERAL DENSITY 03/24/2024 4:27 pm CLINICAL DATA:  58 year old Female Postmenopausal. Screening for osteoporosis Patient is or has been on bone building therapies. TECHNIQUE: An axial (e.g., hips, spine) and/or appendicular (e.g., radius) exam was performed, as appropriate, using GE Secretary/administrator at [employer] [facility]. Images are obtained for bone mineral density measurement and are not obtained for diagnostic purposes. [id number] Exclusions: L3 COMPARISON:  07/08/2020  FINDINGS: Scan quality: Good. LUMBAR SPINE (L1-L2, L4): BMD (in g/cm2): 0.927 T-score: -2.0 Z-score: -1.5 Rate of change from previous exam: -3.1 % LEFT FEMORAL NECK: BMD (in g/cm2): 0.834 T-score: -1.5 Z-score: -1.2 LEFT TOTAL HIP: BMD (in g/cm2): 0.852 T-score: -1.2 Z-score: -1.3 RIGHT FEMORAL NECK: BMD (in g/cm2): 0.825 T-score: -1.5 Z-score: -1.2 RIGHT TOTAL HIP: BMD (in g/cm2): 0.861 T-score: -1.2 Z-score: -1.2 DUAL-FEMUR TOTAL MEAN: Rate of change from previous exam: No significant rate of change from previous exam. FRAX 10-YEAR PROBABILITY OF FRACTURE: FRAX not reported as the patient is receiving bone building therapy. IMPRESSION: Osteopenia based on BMD. Fracture risk is unknown due to history of bone building therapy. RECOMMENDATIONS: 1. All patients should optimize calcium and vitamin D  intake. 2. Consider FDA-approved medical therapies in postmenopausal women and men aged 1 years and older, based on the following: - A hip or vertebral (clinical or morphometric) fracture - T-score less than or equal to -2.5 and secondary causes have been excluded. - Low bone mass (T-score between -1.0 and -2.5) and a 10-year probability of a hip fracture greater than or equal to 3% or a 10-year probability of a major osteoporosis-related fracture greater than or equal to 20% based on the [place] -adapted WHO algorithm. - Clinician judgment and/or patient preferences may indicate treatment for people with 10-year fracture probabilities above or below these levels 3. Patients with diagnosis of osteoporosis or at high risk for fracture should have regular bone mineral density tests. For patients eligible for Medicare, routine testing is allowed once every 2 years. The testing frequency can be increased to one year for patients who have rapidly progressing disease, those who are receiving or discontinuing medical therapy to restore bone mass, or have additional risk factors.  Electronically Signed   By: Sundra Engel M.D.   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  Labs from 02/04/2024: Low ferritin level of 5.6 ng/mL with  low Hgb of 11.7 g/dL.  Previously tolerated IV Ferraheme in 2017. Will plan for 2-dose of IV Ferraheme and labs today.  2. Vitamin-D deficiency.  A. Taking Vitamin-D supplement.  3. Vitamin B-12 deficiency.   A. Taking B-12 supplement.   PLAN: -Discussed the reason for today's visit.  -Discussed with the patient that we can plan for at least 2 doses of IV Feraheme  since she previously tolerated IV Feraheme  well in 2017. Pt agrees.  -Discussed the need for lab-workup today to determine the dose of IV Feraheme , since last lab-workup was more than one month ago. Pt agrees.  -Plan: Labs today. Most likely 2 doses of IV Feraheme .  -Schedule pt got IV Feraheme .  -Answered all of patient's questions.   4. ABnormal LFTs -- elevated AST and ALT and borderline Bilirubin levels. -further evaluation and mx with PCP  FOLLOW-UP: Labs today IV feraheme  weekly x 2 doses at [address] infusion Phone visit with Dr Salomon Cree in 3 months with labs 1-2 days prior to phone visit  .The total time spent in the appointment was 30 minutes* .  All of the patient's questions were answered with apparent satisfaction. The patient knows to call the clinic with any problems, questions or concerns.   Jacquelyn Matt MD MS AAHIVMS [facility] Hematology/Oncology Physician [facility]  .*Total Encounter Time as defined by the Centers for Medicare and Medicaid Services includes, in addition to the face-to-face time of a patient visit (documented in the note above) non-face-to-face time: obtaining and reviewing outside history, ordering and reviewing medications, tests or procedures, care coordination (communications with other health care professionals or caregivers) and documentation in the medical record.    Jacquelyn Matt MD MS AAHIVMS [facility] Hematology/Oncology  Physician [facility]  (Office):       [phone] (Work cell):  [phone] (Fax):           [phone]  03/27/2024 9:38 AM     I,Param Shah,acting as a scribe for Jacquelyn Matt, MD.,have documented all relevant documentation on the behalf of Jacquelyn Matt, MD,as directed by  Jacquelyn Matt, MD while in the presence of Jacquelyn Matt, MD.  .I have reviewed the above documentation for accuracy and completeness, and I agree with the above. .Keria Widrig Kishore Zeferino Mounts MD

## 2024-03-28 ENCOUNTER — Telehealth: Payer: Self-pay | Admitting: Hematology

## 2024-03-28 ENCOUNTER — Ambulatory Visit: Payer: 59 | Admitting: Obstetrics

## 2024-03-28 NOTE — Telephone Encounter (Signed)
 Spoke with patient confirming upcoming appointment

## 2024-04-03 ENCOUNTER — Telehealth: Payer: Self-pay

## 2024-04-03 ENCOUNTER — Encounter: Payer: Self-pay | Admitting: Hematology

## 2024-04-03 NOTE — Telephone Encounter (Signed)
 Dr. Salomon Cree and [facility], patient will be scheduled as soon as possible.  Auth Submission: APPROVED Site of care: Site of care: [facility] Payer: [employer] commercial Medication & CPT/J Code(s) submitted: Feraheme  (ferumoxytol ) [id number] Route of submission (phone, fax, portal): portal Phone # Fax # Auth type: Buy/Bill PB Units/visits requested: 510mg  x 2 doses Reference number: [id number] Approval from: 03/31/24 to 03/31/25

## 2024-04-11 ENCOUNTER — Other Ambulatory Visit (HOSPITAL_COMMUNITY): Payer: Self-pay

## 2024-04-11 ENCOUNTER — Other Ambulatory Visit: Payer: Self-pay

## 2024-04-11 ENCOUNTER — Ambulatory Visit (INDEPENDENT_AMBULATORY_CARE_PROVIDER_SITE_OTHER)

## 2024-04-11 VITALS — BP 131/86 | HR 58 | Temp 98.7°F | Resp 16 | Ht 67.0 in | Wt 212.4 lb

## 2024-04-11 DIAGNOSIS — K909 Intestinal malabsorption, unspecified: Secondary | ICD-10-CM

## 2024-04-11 DIAGNOSIS — D508 Other iron deficiency anemias: Secondary | ICD-10-CM

## 2024-04-11 DIAGNOSIS — Z9884 Bariatric surgery status: Secondary | ICD-10-CM | POA: Diagnosis not present

## 2024-04-11 MED ORDER — ACETAMINOPHEN 325 MG PO TABS
650.0000 mg | ORAL_TABLET | Freq: Once | ORAL | Status: AC
  Administered 2024-04-11: 650 mg via ORAL
  Filled 2024-04-11: qty 2

## 2024-04-11 MED ORDER — LORATADINE 10 MG PO TABS
10.0000 mg | ORAL_TABLET | Freq: Every day | ORAL | Status: DC
  Administered 2024-04-11: 10 mg via ORAL
  Filled 2024-04-11: qty 1

## 2024-04-11 MED ORDER — SODIUM CHLORIDE 0.9 % IV SOLN
510.0000 mg | Freq: Once | INTRAVENOUS | Status: AC
  Administered 2024-04-11: 510 mg via INTRAVENOUS
  Filled 2024-04-11: qty 17

## 2024-04-11 NOTE — Progress Notes (Signed)
 Diagnosis: Iron Deficiency Anemia  Provider:  Praveen Mannam MD  Procedure: IV Infusion  IV Type: Peripheral, IV Location: R Antecubital  Entyvio (Vedolizumab), Dose: 510 mg  Infusion Start Time: 1538  Infusion Stop Time: 1556  Post Infusion IV Care: Observation period completed  Discharge: Condition: Good, Destination: Home . AVS Provided  Performed by:  Natividad Balding, RN

## 2024-04-18 ENCOUNTER — Ambulatory Visit (INDEPENDENT_AMBULATORY_CARE_PROVIDER_SITE_OTHER): Admitting: *Deleted

## 2024-04-18 VITALS — BP 147/86 | HR 52 | Temp 98.0°F | Resp 20 | Ht 67.0 in | Wt 211.0 lb

## 2024-04-18 DIAGNOSIS — D508 Other iron deficiency anemias: Secondary | ICD-10-CM

## 2024-04-18 DIAGNOSIS — K909 Intestinal malabsorption, unspecified: Secondary | ICD-10-CM

## 2024-04-18 MED ORDER — SODIUM CHLORIDE 0.9 % IV SOLN
510.0000 mg | Freq: Once | INTRAVENOUS | Status: AC
  Administered 2024-04-18: 510 mg via INTRAVENOUS
  Filled 2024-04-18: qty 17

## 2024-04-18 MED ORDER — LORATADINE 10 MG PO TABS
10.0000 mg | ORAL_TABLET | Freq: Every day | ORAL | Status: DC
  Administered 2024-04-18: 10 mg via ORAL
  Filled 2024-04-18: qty 1

## 2024-04-18 MED ORDER — ACETAMINOPHEN 325 MG PO TABS
650.0000 mg | ORAL_TABLET | Freq: Once | ORAL | Status: AC
  Administered 2024-04-18: 650 mg via ORAL
  Filled 2024-04-18: qty 2

## 2024-04-18 NOTE — Progress Notes (Signed)
 Diagnosis: Acute Anemia  Provider:  Phyllis Breeze MD  Procedure: IV Infusion  IV Type: Peripheral, IV Location: L Antecubital  Feraheme  (Ferumoxytol ), Dose: 510 mg  Infusion Start Time: 1545  Infusion Stop Time: 1607  Post Infusion IV Care: Observation period completed and Peripheral IV Discontinued  Discharge: Condition: Good, Destination: Home . AVS Declined  Performed by:  Devonne Folk, RN

## 2024-04-21 ENCOUNTER — Other Ambulatory Visit (HOSPITAL_COMMUNITY): Payer: Self-pay

## 2024-04-21 ENCOUNTER — Other Ambulatory Visit (HOSPITAL_BASED_OUTPATIENT_CLINIC_OR_DEPARTMENT_OTHER): Payer: Self-pay

## 2024-04-21 MED ORDER — VITAMIN D (ERGOCALCIFEROL) 1.25 MG (50000 UNIT) PO CAPS
50000.0000 [IU] | ORAL_CAPSULE | ORAL | 5 refills | Status: DC
  Filled 2024-04-21: qty 26, 30d supply, fill #0
  Filled 2024-05-31: qty 26, 30d supply, fill #1
  Filled 2024-06-26: qty 26, 30d supply, fill #2
  Filled 2024-07-30: qty 26, 30d supply, fill #3
  Filled 2024-09-04: qty 26, 30d supply, fill #4
  Filled 2024-10-05: qty 26, 30d supply, fill #5

## 2024-05-10 ENCOUNTER — Other Ambulatory Visit (HOSPITAL_COMMUNITY): Payer: Self-pay

## 2024-05-10 MED ORDER — WEGOVY 2.4 MG/0.75ML ~~LOC~~ SOAJ
2.4000 mg | SUBCUTANEOUS | 0 refills | Status: AC
  Filled 2024-05-10: qty 3, 28d supply, fill #0

## 2024-05-16 ENCOUNTER — Other Ambulatory Visit (HOSPITAL_COMMUNITY): Payer: Self-pay

## 2024-05-28 ENCOUNTER — Ambulatory Visit
Admission: EM | Admit: 2024-05-28 | Discharge: 2024-05-28 | Disposition: A | Discharge status: Home / Self Care | Attending: Family Medicine | Admitting: Family Medicine

## 2024-05-28 DIAGNOSIS — U071 COVID-19: Secondary | ICD-10-CM

## 2024-05-28 LAB — POC SARS CORONAVIRUS 2 AG -  ED: SARS Coronavirus 2 Ag: POSITIVE — AB

## 2024-05-28 LAB — POCT RAPID STREP A (OFFICE): Rapid Strep A Screen: NEGATIVE

## 2024-05-28 MED ORDER — PROMETHAZINE-DM 6.25-15 MG/5ML PO SYRP: 5.0000 mL | ORAL_SOLUTION | Freq: Every evening | ORAL | 0 refills | Status: AC | PRN

## 2024-05-28 MED ORDER — MOLNUPIRAVIR 200 MG PO CAPS: 4.0000 | ORAL_CAPSULE | Freq: Two times a day (BID) | ORAL | 0 refills | Status: AC

## 2024-05-28 NOTE — ED Triage Notes (Signed)
 Pt  she has has a sore throat, runny nose, cough with mucus, and chills x 2 days

## 2024-05-28 NOTE — Discharge Instructions (Addendum)
 You tested positive for COVID-19.  May start molnupiravir which is an antiviral medication used to help manage COVID symptoms.  You may take Promethazine DM as needed at night for your cough.  Please note this medication will make you drowsy.  Do not drink alcohol or drive while on this medication.  Lots of rest and fluids.  You may use over-the-counter Tylenol  or ibuprofen  as needed for fevers.  Please follow-up with your PCP in 2 to 3 days for recheck.  Please go to the ER if you develop any worsening symptoms.  Hope you feel better soon!

## 2024-05-28 NOTE — ED Provider Notes (Signed)
 [facility]    CSN: [id number] Arrival date & time: 05/28/24  9178      History   Chief Complaint Chief Complaint  Patient presents with   Sore Throat   Cough    HPI Jennifer Harrell is a 58 y.o. female  presents for evaluation of URI symptoms for 3 days. Patient reports associated symptoms of sore throat, runny nose, mild cough with chills. Denies N/V/D, fevers, ear pain, body aches, shortness of breath. Patient does not have a hx of asthma. Patient is not an active smoker.   Reports husband has similar symptoms.  Pt has taken DayQuil OTC for symptoms. Pt has no other concerns at this time.    Sore Throat  Cough Associated symptoms: chills and sore throat     Past Medical History:  Diagnosis Date   Anemia    Fibromyalgia    History of breast surgery 1983   reduction   Vitamin D  deficiency     Patient Active Problem List   Diagnosis Date Noted   Iron deficiency anemia 10/10/2015   Right hip pain 01/18/2015   Bilateral knee pain 09/18/2014   Internal bleeding hemorrhoids 06/20/2013   Guaiac positive stools 02/26/2013   Zinc  deficiency 10/24/2012   Vitamin A  deficiency 08/25/2012   Sinus bradycardia 08/15/2012   Allergic rhinitis due to allergen 08/15/2012   Vitamin D  deficiency 05/12/2012   History of anemia 05/10/2012   History of fibromyalgia 05/10/2012   Lymphedema 05/10/2012   Malabsorption syndrome 05/10/2012   Seborrheic dermatitis of scalp 05/10/2012   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   S/P endometrial ablation 05/10/2012   History of abdominoplasty 05/10/2012   DEFICIENCY OF OTHER VITAMINS 02/05/2010   PREMATURE VENTRICULAR CONTRACTIONS 02/05/2010   BRADYCARDIA 02/05/2010    Past Surgical History:  Procedure Laterality Date   ABDOMINOPLASTY  2005   BPDD weight loss surgery  2002   breast reduction     THIGH LIFT     uterine ablation      OB History     Gravida  1   Para  1   Term  1   Preterm      AB      Living   1      SAB      IAB      Ectopic      Multiple      Live Births               Home Medications    Prior to Admission medications   Medication Sig Start Date End Date Taking? Authorizing Provider  molnupiravir EUA (LAGEVRIO) 200 MG CAPS capsule Take 4 capsules (800 mg total) by mouth 2 (two) times daily for 5 days. 05/28/24 06/02/24 Yes Gerard Bonus, Jodi R, NP  promethazine-dextromethorphan (PROMETHAZINE-DM) 6.25-15 MG/5ML syrup Take 5 mLs by mouth at bedtime as needed for cough. 05/28/24  Yes Ryin Schillo, Jodi R, NP  amoxicillin -clavulanate (AUGMENTIN ) 875-125 MG tablet Take 1 tablet by mouth 2 (two) times a day for 10 days. 12/06/23     buPROPion  (WELLBUTRIN  XL) 150 MG 24 hr tablet Take 1 tablet (150 mg total) by mouth every morning for 90 days. 02/15/24     ciprofloxacin  (CIPRO ) 500 MG tablet Take 1 tablet (500 mg total) by mouth every 12 (twelve) hours for 5 days 12/21/23     Estradiol  10 MCG TABS vaginal tablet Insert 1 tablet vaginally at bedtime twice a week as directed. 01/10/24  Multiple Vitamin (MULTIVITAMIN) tablet Take 1 tablet by mouth daily.    [provider]  Semaglutide -Weight Management (WEGOVY ) 0.25 MG/0.5ML SOAJ Inject 0.25 mg into the skin once a week. 12/07/22     Semaglutide -Weight Management (WEGOVY ) 0.5 MG/0.5ML SOAJ Inject 0.5 mg into the skin once a week. 12/07/22     Semaglutide -Weight Management (WEGOVY ) 1 MG/0.5ML SOAJ Inject 1 mg into the skin once a week. 12/07/22     Semaglutide -Weight Management (WEGOVY ) 1.7 MG/0.75ML SOAJ Inject 1.7 mg into the skin once a week. 06/17/23     Semaglutide -Weight Management (WEGOVY ) 1.7 MG/0.75ML SOAJ Inject 1.7 mg into the skin once a week. 09/13/23     Semaglutide -Weight Management (WEGOVY ) 2.4 MG/0.75ML SOAJ Inject 2.4 mg into the skin once a week. 07/15/23     Semaglutide -Weight Management (WEGOVY ) 2.4 MG/0.75ML SOAJ Inject 2.4 mg into the skin once a week. 08/19/23     Semaglutide -Weight Management (WEGOVY ) 2.4 MG/0.75ML SOAJ  Inject 2.4 mg into the skin once a week. 10/26/23     Semaglutide -Weight Management (WEGOVY ) 2.4 MG/0.75ML SOAJ Inject 2.4 mg into the skin once a week. 12/16/23     Semaglutide -Weight Management (WEGOVY ) 2.4 MG/0.75ML SOAJ Inject 2.4 mg into the skin once a week. 05/10/24     Vitamin D , Ergocalciferol , (DRISDOL ) 1.25 MG (50000 UNIT) CAPS capsule Take 1 capsule (50,000 Units total) by mouth 6 days per week 04/21/24     Vitamin D , Ergocalciferol , (DRISDOL ) 50000 UNITS CAPS capsule  10/27/13   [provider]    Family History Family History  Problem Relation Age of Onset   Cancer Maternal Grandfather        stomach   Diabetes Maternal Grandfather    Heart failure Maternal Grandfather    Diabetes Mother    Heart failure Maternal Grandmother     Social History Social History   Tobacco Use   Smoking status: Never   Smokeless tobacco: Never  Substance Use Topics   Alcohol use: No    Alcohol/week: 0.0 standard drinks of alcohol   Drug use: No     Allergies   Ciprofloxacin , Prednisone, Sulfa antibiotics, and Sulfonamide derivatives   Review of Systems Review of Systems  Constitutional:  Positive for chills.  HENT:  Positive for congestion and sore throat.   Respiratory:  Positive for cough.      Physical Exam Triage Vital Signs ED Triage Vitals  Encounter Vitals Group     BP 05/28/24 0834 115/75     Girls Systolic BP Percentile --      Girls Diastolic BP Percentile --      Boys Systolic BP Percentile --      Boys Diastolic BP Percentile --      Pulse Rate 05/28/24 0834 80     Resp 05/28/24 0834 18     Temp 05/28/24 0834 98.8 F (37.1 C)     Temp Source 05/28/24 0834 Oral     SpO2 05/28/24 0834 96 %     Weight --      Height --      Head Circumference --      Peak Flow --      Pain Score 05/28/24 0836 6     Pain Loc --      Pain Education --      Exclude from Growth Chart --    No data found.  Updated Vital Signs BP 115/75 (BP Location: Right Arm)    Pulse 80   Temp 98.8 F (  37.1 C) (Oral)   Resp 18   SpO2 96%   Visual Acuity Right Eye Distance:   Left Eye Distance:   Bilateral Distance:    Right Eye Near:   Left Eye Near:    Bilateral Near:     Physical Exam Vitals and nursing note reviewed.  Constitutional:      General: She is not in acute distress.    Appearance: She is well-developed. She is not ill-appearing.  HENT:     Head: Normocephalic and atraumatic.     Right Ear: Tympanic membrane and ear canal normal.     Left Ear: Tympanic membrane and ear canal normal.     Nose: Congestion present.     Mouth/Throat:     Mouth: Mucous membranes are moist.     Pharynx: Oropharynx is clear. Uvula midline. Posterior oropharyngeal erythema present.     Tonsils: No tonsillar exudate or tonsillar abscesses.   Eyes:     Conjunctiva/sclera: Conjunctivae normal.     Pupils: Pupils are equal, round, and reactive to light.    Cardiovascular:     Rate and Rhythm: Normal rate and regular rhythm.     Heart sounds: Normal heart sounds.  Pulmonary:     Effort: Pulmonary effort is normal.     Breath sounds: Normal breath sounds. No wheezing, rhonchi or rales.   Musculoskeletal:     Cervical back: Normal range of motion and neck supple.  Lymphadenopathy:     Cervical: No cervical adenopathy.   Skin:    General: Skin is warm and dry.   Neurological:     General: No focal deficit present.     Mental Status: She is alert and oriented to person, place, and time.   Psychiatric:        Mood and Affect: Mood normal.        Behavior: Behavior normal.      UC Treatments / Results  Labs (all labs ordered are listed, but only abnormal results are displayed) Labs Reviewed  POC SARS CORONAVIRUS 2 AG -  ED - Abnormal; Notable for the following components:      Result Value   SARS Coronavirus 2 Ag Positive (*)    All other components within normal limits  POCT RAPID STREP A (OFFICE)   CMP (Cancer Center only) Order:  [id number]  Status: Final result     Next appt: 06/28/2024 at 09:00 AM in Oncology ([facility])     Dx: History of gastric bypass; Iron defic...   Test Result Released: Yes (seen)   0 Result Notes          Component Ref Range & Units (hover) 2 mo ago (03/27/24) 2 yr ago (03/08/22) 2 yr ago (12/08/21) 7 yr ago (09/07/16) 8 yr ago (10/10/15) 12 yr ago (05/10/12) 15 yr ago (06/22/08)  Sodium 142 142 139 138 145 R 143 R 142 R  Potassium 4.0 3.7 3.8 4.6 4.1 R 4.0 R 4.0 R  Chloride 108 110 107 109 R 111 High  R 109 R 109 R  CO2 28 26 22 23 25  R 24 R 24 R  Glucose, Bld 80 87 CM 88 CM 113 High  R 86 R, CM 76 65 Low   Comment: Glucose reference range applies only to samples taken after fasting for at least 8 hours.  BUN 10 10 15 9  13.3 R 9 R 9 R  Creatinine 0.89 0.79 0.84 0.59 0.7 R 0.54 R 0.67 R  Calcium 8.8  Low  8.3 Low  9.5 8.9 8.9 R 8.3 Low  R 8.4 R  Total Protein 6.7    6.7 R 6.4 R 6.4 R  Albumin 4.2    4.0 4.0 R 3.8 R  AST 93 High     43 High  R 22 R 17 R  ALT 84 High     40 R 20 R 13 R  Alkaline Phosphatase 121    125 R 86 R 61 R  Total Bilirubin 1.5 High     1.37 High  R 1.2 R 0.8 R  GFR, Estimated >60   >60     Comment: (NOTE) Calculated using the CKD-EPI Creatinine Equation (2021)  Anion gap 6 6 CM 10 CM 6 8 R    Comment: Performed at [facility], [address], [place], [place] [address]  Resulting Agency [facility]        Specimen Collected: 03/27/24 12:14 Last Resulted: 03/27/24 12:52      EKG   Radiology No results found.  Procedures Procedures (including critical care time)  Medications Ordered in UC Medications - No data to display  Initial Impression / Assessment and Plan / UC Course  I have reviewed the triage vital signs and the nursing notes.  Pertinent labs & imaging results that were available during my care of the patient were reviewed by me and  considered in my medical decision making (see chart for details).     Reviewed exam and symptoms with patient.  Negative rapid strep, positive COVID-19.  Patient states she has not had COVID in the past but has been vaccinated.  She does qualify for antiviral medications.  Labs were reviewed.  Given her elevated liver enzymes from her most recent labs, we will do molnupiravir twice daily for 5 days.  Promethazine DM as needed for cough.  Discussed rest fluids and OTC analgesics as needed.  Reviewed CDC guidelines for quarantine.  PCP follow-up in 2 to 3 days for recheck.  ER precautions reviewed. Final Clinical Impressions(s) / UC Diagnoses   Final diagnoses:  COVID-19     Discharge Instructions      You tested positive for COVID-19.  May start molnupiravir which is an antiviral medication used to help manage COVID symptoms.  You may take Promethazine DM as needed at night for your cough.  Please note this medication will make you drowsy.  Do not drink alcohol or drive while on this medication.  Lots of rest and fluids.  You may use over-the-counter Tylenol  or ibuprofen  as needed for fevers.  Please follow-up with your PCP in 2 to 3 days for recheck.  Please go to the ER if you develop any worsening symptoms.  Hope you feel better soon!    ED Prescriptions     Medication Sig Dispense Auth. Provider   promethazine-dextromethorphan (PROMETHAZINE-DM) 6.25-15 MG/5ML syrup Take 5 mLs by mouth at bedtime as needed for cough. 118 mL Tiearra Colwell, Jodi R, NP   molnupiravir EUA (LAGEVRIO) 200 MG CAPS capsule Take 4 capsules (800 mg total) by mouth 2 (two) times daily for 5 days. 40 capsule Bertie Mcconathy, Jodi R, NP      PDMP not reviewed this encounter.   Loreda Myla SAUNDERS, NP 05/28/24 [phone]

## 2024-06-20 ENCOUNTER — Other Ambulatory Visit (HOSPITAL_COMMUNITY): Payer: Self-pay

## 2024-06-20 MED ORDER — WEGOVY 1.7 MG/0.75ML ~~LOC~~ SOAJ
1.7000 mg | SUBCUTANEOUS | 3 refills | Status: AC
  Filled 2024-06-20 – 2024-06-24 (×2): qty 3, 28d supply, fill #0

## 2024-06-21 ENCOUNTER — Other Ambulatory Visit (HOSPITAL_COMMUNITY): Payer: Self-pay

## 2024-06-24 ENCOUNTER — Other Ambulatory Visit (HOSPITAL_COMMUNITY): Payer: Self-pay

## 2024-06-28 ENCOUNTER — Other Ambulatory Visit: Payer: Self-pay | Admitting: *Deleted

## 2024-06-28 ENCOUNTER — Inpatient Hospital Stay: Attending: Hematology

## 2024-06-28 ENCOUNTER — Other Ambulatory Visit (HOSPITAL_COMMUNITY): Payer: Self-pay

## 2024-06-28 DIAGNOSIS — E611 Iron deficiency: Secondary | ICD-10-CM | POA: Insufficient documentation

## 2024-06-28 DIAGNOSIS — D509 Iron deficiency anemia, unspecified: Secondary | ICD-10-CM

## 2024-06-28 LAB — IRON AND IRON BINDING CAPACITY (CC-WL,HP ONLY)
Iron: 142 ug/dL (ref 28–170)
Saturation Ratios: 50 % — ABNORMAL HIGH (ref 10.4–31.8)
TIBC: 286 ug/dL (ref 250–450)
UIBC: 144 ug/dL — ABNORMAL LOW (ref 148–442)

## 2024-06-28 LAB — CBC WITH DIFFERENTIAL (CANCER CENTER ONLY)
Abs Immature Granulocytes: 0.01 K/uL (ref 0.00–0.07)
Basophils Absolute: 0 K/uL (ref 0.0–0.1)
Basophils Relative: 0 %
Eosinophils Absolute: 0.1 K/uL (ref 0.0–0.5)
Eosinophils Relative: 1 %
HCT: 39.4 % (ref 36.0–46.0)
Hemoglobin: 12.7 g/dL (ref 12.0–15.0)
Immature Granulocytes: 0 %
Lymphocytes Relative: 30 %
Lymphs Abs: 1.4 K/uL (ref 0.7–4.0)
MCH: 30.6 pg (ref 26.0–34.0)
MCHC: 32.2 g/dL (ref 30.0–36.0)
MCV: 94.9 fL (ref 80.0–100.0)
Monocytes Absolute: 0.5 K/uL (ref 0.1–1.0)
Monocytes Relative: 11 %
Neutro Abs: 2.6 K/uL (ref 1.7–7.7)
Neutrophils Relative %: 58 %
Platelet Count: 167 K/uL (ref 150–400)
RBC: 4.15 MIL/uL (ref 3.87–5.11)
RDW: 13.4 % (ref 11.5–15.5)
WBC Count: 4.6 K/uL (ref 4.0–10.5)
nRBC: 0 % (ref 0.0–0.2)

## 2024-06-28 LAB — FERRITIN: Ferritin: 264 ng/mL (ref 11–307)

## 2024-06-28 LAB — CMP (CANCER CENTER ONLY)
ALT: 40 U/L (ref 0–44)
AST: 33 U/L (ref 15–41)
Albumin: 3.8 g/dL (ref 3.5–5.0)
Alkaline Phosphatase: 84 U/L (ref 38–126)
Anion gap: 3 — ABNORMAL LOW (ref 5–15)
BUN: 12 mg/dL (ref 6–20)
CO2: 29 mmol/L (ref 22–32)
Calcium: 8.9 mg/dL (ref 8.9–10.3)
Chloride: 110 mmol/L (ref 98–111)
Creatinine: 0.76 mg/dL (ref 0.44–1.00)
GFR, Estimated: >60 mL/min (ref >60)
Glucose, Bld: 83 mg/dL (ref 70–99)
Potassium: 4.5 mmol/L (ref 3.5–5.1)
Sodium: 142 mmol/L (ref 135–145)
Total Bilirubin: 1 mg/dL (ref 0.0–1.2)
Total Protein: 6.5 g/dL (ref 6.5–8.1)

## 2024-06-28 LAB — VITAMIN B12: Vitamin B-12: 918 pg/mL — ABNORMAL HIGH (ref 180–914)

## 2024-06-29 NOTE — Progress Notes (Signed)
 HEMATOLOGY/ONCOLOGY PHONE VISIT NOTE  Date of Service: 06/30/2024  Patient Care Team: [facility] as PCP - General  CHIEF COMPLAINTS/PURPOSE OF CONSULTATION:  low Ferritin and inability to absorb iron due to Gastric Bypass  HISTORY OF PRESENTING ILLNESS:   Jennifer Harrell is a wonderful 58 y.o. female who has been referred to us  by Dr. 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   Labs from 02/04/2024: CBC showed low Hgb of 11.7 g/dL, lab range from 87.9-83.9 g/dL, low Hct of 64.4%, lab range from 37.0-47.0%. CMP stable. Low ferritin level of 5.6 ng/mL with lab range from 11.0 to 306.8 ng/mL. B-12 level of 619.    Patient was previously seen by us , with last visit on 03/06/2016, for Iron Deficiency Anemia. She was treated with IV Feraheme .  Patient notes she has been doing well overall since our last visit. She has been doing well overall in the past 42-months. She does complain of fatigue and pagophagia.   She is currently on Wegovy  2.4 mg weekly. She takes Vitamin B-12 and Vitamin-D supplement.   She denies any new infection issues, fever, chills, night sweats, unexpected weight loss, back pain, chest pain, abdominal pain, or leg swelling.   She denies Zinc  or copper deficiency.   INTERVAL HISTORY:  Jennifer Harrell is a 58 y.o. female here for continued evaluation and management of iron deficiency and hx of gastric bypass inhibiting ability to absorb iron.   She was last seen by me on 03/27/2024 and complained of fatigue and pagophagia.   I connected with Jennifer Harrell on 06/30/2024 at  3:30 PM EDT by telephone visit and verified that I am speaking with the correct person using two identifiers.   I discussed the limitations, risks, security and privacy concerns of performing an evaluation and management service by telemedicine and the  availability of in-person appointments. I also discussed with the patient that there may be a patient responsible charge related to this service. The patient expressed understanding and agreed to proceed.   Other persons participating in the visit and their role in the encounter: none   Patient's location: home  Provider's location: [facility]   Chief Complaint: iron deficiency and hx of gastric bypass inhibiting ability to absorb iron    The results of her recent lab results from 06/28/2024 were discussed with her in detail.  She reports that she has been doing well overall since her last clinical visit. Patient reports that she tolerated IV well with no toxicity issues.   She reports improved energy levels and no longer has ice cravings.   MEDICAL HISTORY:  Past Medical History:  Diagnosis Date   Anemia    Fibromyalgia    History of breast surgery 1983   reduction   Vitamin D  deficiency     SURGICAL HISTORY: Past Surgical History:  Procedure Laterality Date   ABDOMINOPLASTY  2005   BPDD weight loss surgery  2002   breast reduction     THIGH LIFT     uterine ablation      SOCIAL HISTORY: Social History   Socioeconomic History   Marital status: Married    Spouse name: Not on file   Number of children: Not on file   Years of education: Not on file   Highest education level: Not on file  Occupational History   Occupation: Cytogeneticist  Tobacco  Use   Smoking status: Never   Smokeless tobacco: Never  Substance and Sexual Activity   Alcohol use: No    Alcohol/week: 0.0 standard drinks of alcohol   Drug use: No   Sexual activity: Not on file  Other Topics Concern   Not on file  Social History Narrative   Not on file   Social Drivers of Health   Financial Resource Strain: Not on file  Food Insecurity: Low Risk  (12/06/2023)   Received from [facility]   Hunger Vital Sign    Within the past 12 months, you worried that your food would run out before you got money  to buy more: Never true    Within the past 12 months, the food you bought just didn't last and you didn't have money to get more. : Never true  Transportation Needs: No Transportation Needs (12/06/2023)   Received from [facility]    In the past 12 months, has lack of reliable transportation kept you from medical appointments, meetings, work or from getting things needed for daily living? : No  Physical Activity: Not on file  Stress: Not on file  Social Connections: Not on file  Intimate Partner Violence: Not on file    FAMILY HISTORY: Family History  Problem Relation Age of Onset   Cancer Maternal Grandfather        stomach   Diabetes Maternal Grandfather    Heart failure Maternal Grandfather    Diabetes Mother    Heart failure Maternal Grandmother     ALLERGIES:  is allergic to ciprofloxacin , prednisone, sulfa antibiotics, and sulfonamide derivatives.  MEDICATIONS:  Current Outpatient Medications  Medication Sig Dispense Refill   amoxicillin -clavulanate (AUGMENTIN ) 875-125 MG tablet Take 1 tablet by mouth 2 (two) times a day for 10 days. 20 tablet 0   buPROPion  (WELLBUTRIN  XL) 150 MG 24 hr tablet Take 1 tablet (150 mg total) by mouth every morning for 90 days. 90 tablet 1   ciprofloxacin  (CIPRO ) 500 MG tablet Take 1 tablet (500 mg total) by mouth every 12 (twelve) hours for 5 days 10 tablet 0   Estradiol  10 MCG TABS vaginal tablet Insert 1 tablet vaginally at bedtime twice a week as directed. 24 tablet 4   Multiple Vitamin (MULTIVITAMIN) tablet Take 1 tablet by mouth daily.     promethazine -dextromethorphan (PROMETHAZINE -DM) 6.25-15 MG/5ML syrup Take 5 mLs by mouth at bedtime as needed for cough. 118 mL 0   Semaglutide -Weight Management (WEGOVY ) 0.25 MG/0.5ML SOAJ Inject 0.25 mg into the skin once a week. 2 mL 0   Semaglutide -Weight Management (WEGOVY ) 0.5 MG/0.5ML SOAJ Inject 0.5 mg into the skin once a week. 2 mL 0   Semaglutide -Weight Management (WEGOVY ) 1  MG/0.5ML SOAJ Inject 1 mg into the skin once a week. 2 mL 0   Semaglutide -Weight Management (WEGOVY ) 1.7 MG/0.75ML SOAJ Inject 1.7 mg into the skin once a week. 2 mL 2   Semaglutide -Weight Management (WEGOVY ) 1.7 MG/0.75ML SOAJ Inject 1.7 mg into the skin once a week. 3 mL 2   Semaglutide -Weight Management (WEGOVY ) 1.7 MG/0.75ML SOAJ Inject 1.7 mg into the skin once a week. 3 mL 3   Semaglutide -Weight Management (WEGOVY ) 2.4 MG/0.75ML SOAJ Inject 2.4 mg into the skin once a week. 2 mL 2   Semaglutide -Weight Management (WEGOVY ) 2.4 MG/0.75ML SOAJ Inject 2.4 mg into the skin once a week. 3 mL 2   Semaglutide -Weight Management (WEGOVY ) 2.4 MG/0.75ML SOAJ Inject 2.4 mg into the skin once a week.  2 mL 1   Semaglutide -Weight Management (WEGOVY ) 2.4 MG/0.75ML SOAJ Inject 2.4 mg into the skin once a week. 3 mL 3   Semaglutide -Weight Management (WEGOVY ) 2.4 MG/0.75ML SOAJ Inject 2.4 mg into the skin once a week. 3 mL 0   Vitamin D , Ergocalciferol , (DRISDOL ) 1.25 MG (50000 UNIT) CAPS capsule Take 1 capsule (50,000 Units total) by mouth 6 days per week 26 capsule 5   Vitamin D , Ergocalciferol , (DRISDOL ) 50000 UNITS CAPS capsule      No current facility-administered medications for this visit.    REVIEW OF SYSTEMS:    10 Point review of Systems was done is negative except as noted above.  PHYSICAL EXAMINATION: TELEMEDICINE VISIT ECOG PERFORMANCE STATUS: 1 - Symptomatic but completely ambulatory  . There were no vitals filed for this visit.  There were no vitals filed for this visit.  .There is no height or weight on file to calculate BMI.  LABORATORY DATA:  I have reviewed the data as listed  .    Latest Ref Rng & Units 06/28/2024    9:03 AM 03/27/2024   12:14 PM 03/08/2022   10:38 AM  CBC  WBC 4.0 - 10.5 K/uL 4.6  5.1  6.4   Hemoglobin 12.0 - 15.0 g/dL 87.2  87.7  87.7   Hematocrit 36.0 - 46.0 % 39.4  38.6  39.2   Platelets 150 - 400 K/uL 167  173  198     .    Latest Ref Rng & Units  06/28/2024    9:03 AM 03/27/2024   12:14 PM 03/08/2022   10:38 AM  CMP  Glucose 70 - 99 mg/dL 83  80  87   BUN 6 - 20 mg/dL 12  10  10    Creatinine 0.44 - 1.00 mg/dL 9.23  9.10  9.20   Sodium 135 - 145 mmol/L 142  142  142   Potassium 3.5 - 5.1 mmol/L 4.5  4.0  3.7   Chloride 98 - 111 mmol/L 110  108  110   CO2 22 - 32 mmol/L 29  28  26    Calcium 8.9 - 10.3 mg/dL 8.9  8.8  8.3   Total Protein 6.5 - 8.1 g/dL 6.5  6.7    Total Bilirubin 0.0 - 1.2 mg/dL 1.0  1.5    Alkaline Phos 38 - 126 U/L 84  121    AST 15 - 41 U/L 33  93    ALT 0 - 44 U/L 40  84       RADIOGRAPHIC STUDIES: I have personally reviewed the radiological images as listed and agreed with the findings in the report. No results found.   ASSESSMENT & PLAN:   58 y.o. 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  Labs from 02/04/2024: Low ferritin level of 5.6 ng/mL with  low Hgb of 11.7 g/dL.  Previously tolerated IV Ferraheme in 2017. Will plan for 2-dose of IV Ferraheme and labs today.  2. Vitamin-D deficiency.  A. Taking Vitamin-D supplement.  3. Vitamin B-12 deficiency.   A. Taking B-12 supplement.   PLAN:  -Discussed lab results from 06/28/2024 in detail with patient. CBC normal, showed WBC of 4.6K, hemoglobin of 12.7, and platelets of 167K. -iron levels improved -ferritin improved from 14 to 264 -iron saturation 50% -vitamin B12 normal -CMP shows that her previous elevation in liver function, which were possibly from a passing infection, have resolved at this time - AST 33, ALT 40, total bilirubin 1 -discussed  option of monitoring her iron with labs every 6 months with her PCP and only see us  as needed or to alternatively repeat labs with us  in 1 year -patient is inclined to follow-up with us  once a year with labs  FOLLOW-UP: RTC with Dr Onesimo with labs in 12 months  The total time spent in the appointment was 15 minutes* .  All of the patient's questions were answered with apparent satisfaction.  The patient knows to call the clinic with any problems, questions or concerns.   Emaline Onesimo MD MS AAHIVMS [facility] Hematology/Oncology Physician [facility]  .*Total Encounter Time as defined by the Centers for Medicare and Medicaid Services includes, in addition to the face-to-face time of a patient visit (documented in the note above) non-face-to-face time: obtaining and reviewing outside history, ordering and reviewing medications, tests or procedures, care coordination (communications with other health care professionals or caregivers) and documentation in the medical record.    I,Mitra Faeizi,acting as a Neurosurgeon for Emaline Onesimo, MD.,have documented all relevant documentation on the behalf of Emaline Onesimo, MD,as directed by  Emaline Onesimo, MD while in the presence of Emaline Onesimo, MD.  .I have reviewed the above documentation for accuracy and completeness, and I agree with the above. .Fairy Ashlock Kishore Rosemond Lyttle MD

## 2024-06-30 ENCOUNTER — Inpatient Hospital Stay: Attending: Hematology | Admitting: Hematology

## 2024-06-30 DIAGNOSIS — E611 Iron deficiency: Secondary | ICD-10-CM | POA: Insufficient documentation

## 2024-06-30 DIAGNOSIS — D509 Iron deficiency anemia, unspecified: Secondary | ICD-10-CM

## 2024-06-30 DIAGNOSIS — Z9884 Bariatric surgery status: Secondary | ICD-10-CM | POA: Diagnosis not present

## 2024-06-30 DIAGNOSIS — E538 Deficiency of other specified B group vitamins: Secondary | ICD-10-CM | POA: Diagnosis not present

## 2024-06-30 DIAGNOSIS — E559 Vitamin D deficiency, unspecified: Secondary | ICD-10-CM | POA: Diagnosis not present

## 2024-07-10 ENCOUNTER — Other Ambulatory Visit (HOSPITAL_COMMUNITY): Payer: Self-pay

## 2024-07-10 MED ORDER — BETAMETHASONE DIPROPIONATE AUG 0.05 % EX OINT
1.0000 | TOPICAL_OINTMENT | Freq: Two times a day (BID) | CUTANEOUS | 0 refills | Status: AC
  Filled 2024-07-10: qty 50, 25d supply, fill #0

## 2024-07-19 ENCOUNTER — Other Ambulatory Visit (HOSPITAL_COMMUNITY): Payer: Self-pay

## 2024-07-19 MED ORDER — WEGOVY 2.4 MG/0.75ML ~~LOC~~ SOAJ
2.4000 mg | SUBCUTANEOUS | 1 refills | Status: DC
  Filled 2024-07-19: qty 3, 28d supply, fill #0
  Filled 2024-08-22: qty 3, 28d supply, fill #1

## 2024-07-20 ENCOUNTER — Other Ambulatory Visit (HOSPITAL_COMMUNITY): Payer: Self-pay

## 2024-08-01 ENCOUNTER — Other Ambulatory Visit (HOSPITAL_COMMUNITY): Payer: Self-pay

## 2024-09-04 ENCOUNTER — Other Ambulatory Visit (HOSPITAL_COMMUNITY): Payer: Self-pay

## 2024-09-04 ENCOUNTER — Other Ambulatory Visit: Payer: Self-pay

## 2024-09-04 MED ORDER — WEGOVY 2.4 MG/0.75ML ~~LOC~~ SOAJ
2.4000 mg | SUBCUTANEOUS | 1 refills | Status: DC
  Filled 2024-09-04 – 2024-09-12 (×3): qty 3, 28d supply, fill #0
  Filled 2024-10-13: qty 3, 28d supply, fill #1

## 2024-09-05 ENCOUNTER — Other Ambulatory Visit (HOSPITAL_COMMUNITY): Payer: Self-pay

## 2024-09-07 ENCOUNTER — Other Ambulatory Visit (HOSPITAL_COMMUNITY): Payer: Self-pay

## 2024-09-07 ENCOUNTER — Other Ambulatory Visit: Payer: Self-pay

## 2024-09-12 ENCOUNTER — Other Ambulatory Visit (HOSPITAL_COMMUNITY): Payer: Self-pay

## 2024-10-04 ENCOUNTER — Ambulatory Visit (HOSPITAL_COMMUNITY)
Admission: RE | Admit: 2024-10-04 | Discharge: 2024-10-04 | Disposition: A | Discharge status: Home / Self Care | Source: Ambulatory Visit | Attending: Vascular Surgery | Admitting: Vascular Surgery

## 2024-10-04 ENCOUNTER — Other Ambulatory Visit (HOSPITAL_COMMUNITY): Payer: Self-pay | Admitting: Internal Medicine

## 2024-10-04 DIAGNOSIS — M7989 Other specified soft tissue disorders: Secondary | ICD-10-CM | POA: Insufficient documentation

## 2024-10-05 ENCOUNTER — Other Ambulatory Visit: Payer: Self-pay

## 2024-10-05 ENCOUNTER — Other Ambulatory Visit (HOSPITAL_COMMUNITY): Payer: Self-pay

## 2024-10-05 MED ORDER — BUPROPION HCL ER (XL) 150 MG PO TB24
150.0000 mg | ORAL_TABLET | Freq: Every morning | ORAL | 0 refills | Status: AC
  Filled 2024-10-05: qty 30, 30d supply, fill #0
  Filled 2024-11-15: qty 30, 30d supply, fill #1
  Filled 2024-12-11: qty 30, 30d supply, fill #2

## 2024-10-13 ENCOUNTER — Other Ambulatory Visit (HOSPITAL_COMMUNITY): Payer: Self-pay

## 2024-11-15 ENCOUNTER — Other Ambulatory Visit (HOSPITAL_COMMUNITY): Payer: Self-pay

## 2024-11-15 MED ORDER — WEGOVY 2.4 MG/0.75ML ~~LOC~~ SOAJ
2.4000 mg | SUBCUTANEOUS | 3 refills | Status: AC
  Filled 2024-11-15: qty 3, 28d supply, fill #0
  Filled 2024-12-11: qty 3, 28d supply, fill #1

## 2024-12-11 ENCOUNTER — Encounter: Payer: Self-pay | Admitting: *Deleted

## 2024-12-12 ENCOUNTER — Other Ambulatory Visit (HOSPITAL_COMMUNITY): Payer: Self-pay

## 2024-12-12 MED ORDER — VITAMIN D (ERGOCALCIFEROL) 1.25 MG (50000 UNIT) PO CAPS
ORAL_CAPSULE | ORAL | 5 refills | Status: AC
  Filled 2024-12-12: qty 26, 30d supply, fill #0

## 2025-07-06 ENCOUNTER — Other Ambulatory Visit

## 2025-07-06 ENCOUNTER — Ambulatory Visit: Admitting: Hematology
